# Patient Record
Sex: Male | Born: 1980
Health system: Southern US, Academic
[De-identification: ages and names within clinical notes are randomized; demographics above are authoritative.]

## PROBLEM LIST (undated history)

## (undated) ENCOUNTER — Encounter

## (undated) DIAGNOSIS — I10 Essential (primary) hypertension: Secondary | ICD-10-CM

## (undated) DIAGNOSIS — E669 Obesity, unspecified: Secondary | ICD-10-CM

## (undated) DIAGNOSIS — E785 Hyperlipidemia, unspecified: Secondary | ICD-10-CM

## (undated) DIAGNOSIS — C801 Malignant (primary) neoplasm, unspecified: Secondary | ICD-10-CM

## (undated) HISTORY — DX: Hyperlipidemia, unspecified: E78.5

## (undated) HISTORY — PX: EYE SURGERY: SHX253

## (undated) HISTORY — DX: Obesity, unspecified: E66.9

## (undated) HISTORY — PX: VASECTOMY: SHX75

---
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
## 2016-06-17 ENCOUNTER — Ambulatory Visit

## 2016-06-17 DIAGNOSIS — J020 Streptococcal pharyngitis: Secondary | ICD-10-CM

## 2016-06-17 DIAGNOSIS — J029 Acute pharyngitis, unspecified: Principal | ICD-10-CM

## 2016-06-21 MED ORDER — PENICILLIN G BENZATHINE 1200000 UNIT/2ML IM SUSP
1.2 10*6.[IU] | Freq: Once | INTRAMUSCULAR | Status: CP
Start: 2016-06-21 — End: ?

## 2016-06-21 MED ORDER — DEXAMETHASONE SODIUM PHOSPHATE 4 MG/ML IJ SOLN
8 mg | Freq: Once | INTRAMUSCULAR | Status: CP
Start: 2016-06-21 — End: ?

## 2016-06-21 NOTE — Progress Notes
-  ER at 06/17/16 [id number]         User Key  (r) = Recorded By, (t) = Taken By, (c) = Cosigned By    Initials Name Effective Dates    ER Fuller SongRosemond-Smith, Erica, [place] 10/08/14 -         Physical Exam   Constitutional: He is oriented to person, place, and time. He appears well-developed and well-nourished. No distress.   HENT:   Head: Normocephalic and atraumatic.   Right Ear: Tympanic membrane, external ear and ear canal normal.   Left Ear: Tympanic membrane, external ear and ear canal normal.   Mouth/Throat: Uvula is midline and mucous membranes are normal. No tonsillar exudate.   Edematous almost touching tonsils.  Pharyngeal erythema.   Eyes: Conjunctivae are normal. No scleral icterus.   Neck: Neck supple. No thyromegaly present.   Cardiovascular: Normal rate.    Pulmonary/Chest: Effort normal and breath sounds normal. No respiratory distress. He has no wheezes.   Lymphadenopathy:     He has no cervical adenopathy.     He has no axillary adenopathy.        Right: No supraclavicular adenopathy present.        Left: No supraclavicular adenopathy present.   Neurological: He is alert and oriented to person, place, and time.   Skin: Skin is intact. No pallor.   Psychiatric: He has a normal mood and affect. Thought content normal.   Nursing note and vitals reviewed.       Assessment:       ICD-10-CM ICD-9-CM    1. Sore throat J02.9 462 POCT rapid strep A-Inhouse test      dexamethasone (DECADRON) injection 8 mg      penicillin G benzathine (BICILLIN L-A) injection 1.2 Million Units   2. Strep throat J02.0 034.0 dexamethasone (DECADRON) injection 8 mg      penicillin G benzathine (BICILLIN L-A) injection 1.2 Million Units          Plan:     No orders of the following type(s) were placed in this encounter: Medications.     Orders Placed This Encounter   Procedures   ? POCT rapid strep A-Inhouse test       Health Maintenance was reviewed. The patient's HM Topic list was:

## 2016-06-21 NOTE — Progress Notes
Health Maintenance   Topic Date Due   ? Preventive Wellness Visit  11/10/2016 (Originally 05/08/1997)   ? USPSTF HIV Risk Assessment  12/07/2016 (Originally 05/08/1994)   ? Lipid Profile  12/08/2016 (Originally 05/08/2014)   ? Varicella Vaccine (1 of 2 - 2 Dose Adolescent Series) 01/05/2017 (Originally 05/08/1992)   ? DTaP,Tdap,and Td Vaccines (1 - Tdap) 01/12/2017 (Originally 05/08/1998)   ? Influenza Vaccine  Addressed       Results for orders placed or performed in visit on 06/17/16   POCT rapid strep A-Inhouse test   Result Value Ref Range    Strep A Ag Positive (A) None Detected, Presumptive Postive. Positive results are unconfirmed and should be used for medical evaluation only., Not Detected, Indeterminate    Lot Number      Expiration Date         Treatment plan as above.  Patient to return to clinic if symptoms worsen or fail to improve as anticipated.      I, Thana FarrJessica Castillo, have acted as a Neurosurgeonscribe for Evan DysJeff Jacqmein, MD. 8:47 AM 06/17/2016    ***

## 2016-06-21 NOTE — Progress Notes
Constitutional: Negative for activity change, appetite change, chills, diaphoresis, fatigue, fever and unexpected weight change.   HENT: Positive for sore throat, trouble swallowing and voice change. Negative for congestion, dental problem, drooling, ear discharge, ear pain, facial swelling, hearing loss, mouth sores, nosebleeds, postnasal drip, rhinorrhea, sinus pain and sinus pressure.    Eyes: Negative for visual disturbance.   Respiratory: Negative for cough, chest tightness, shortness of breath and wheezing.    Cardiovascular: Negative for chest pain and palpitations.   Gastrointestinal: Negative.    Genitourinary: Negative.    Musculoskeletal: Negative for arthralgias, myalgias and neck pain.   Skin: Negative for rash.   Neurological: Negative for dizziness, light-headedness and headaches.   Psychiatric/Behavioral: Negative for sleep disturbance.         Objective:        VITAL SIGNS (all recorded)      Clinic Vitals       06/17/16 0836             Amb Encounter Vitals    Weight 125.6 kg (277 lb)    -ER at 06/17/16 0837       Height 1.702 m (5\' 7" )    -ER at 06/17/16 0837       BMI (Calculated) 43.48    -ER at 06/17/16 0837       BSA (Calculated - sq m) 2.44    -ER at 06/17/16 0837       BP 138/90    -ER at 06/17/16 0837       BP Location Left upper arm    -ER at 06/17/16 [id number]       Position Sitting    -ER at 06/17/16 0837       Pulse 80    -ER at 06/17/16 0837       Pulse Source Radial    -ER at 06/17/16 0837       Resp 18    -ER at 06/17/16 0837       Temp 37.6 ?C (99.7 ?F)    -ER at 06/17/16 0837       Temperature Source Oral    -ER at 06/17/16 0837       O2 Saturation 97 %    -ER at 06/17/16 0837       Pain Score SEVEN    -ER at 06/17/16 [id number]       Education/Communication Barriers?    Learning/Communication Barriers? No    -ER at 06/17/16 [id number]       Fall Risk Assessment    Had recent fall / Last 6 months? No recent fall    -ER at 06/17/16 [id number]       Does patient have a fear of falling? No

## 2016-06-21 NOTE — Progress Notes
Health Maintenance   Topic Date Due   ? Preventive Wellness Visit  11/10/2016 (Originally 05/08/1997)   ? USPSTF HIV Risk Assessment  12/07/2016 (Originally 05/08/1994)   ? Lipid Profile  12/08/2016 (Originally 05/08/2014)   ? Varicella Vaccine (1 of 2 - 2 Dose Adolescent Series) 01/05/2017 (Originally 05/08/1992)   ? DTaP,Tdap,and Td Vaccines (1 - Tdap) 01/12/2017 (Originally 05/08/1998)   ? Influenza Vaccine  Addressed       Results for orders placed or performed in visit on 06/17/16   POCT rapid strep A-Inhouse test   Result Value Ref Range    Strep A Ag Positive (A) None Detected, Presumptive Postive. Positive results are unconfirmed and should be used for medical evaluation only., Not Detected, Indeterminate    Lot Number      Expiration Date         Treatment plan as above.  Patient to return to clinic if symptoms worsen or fail to improve as anticipated.      I, Thana FarrJessica Castillo, have acted as a Neurosurgeonscribe for Loyce DysJeff Jacqmein, MD. 8:47 AM 06/17/2016    I have reviewed and confirmed the information stated by the scribe and made corrections and edits as appropriate.  I have personally provided the services documented by the scribe.      Madalyn RobJeffry A Jacqmein, MD

## 2016-06-21 NOTE — Progress Notes
Subjective:   Evan MontgomeryBenjamin L Sanford is a 35 y.o. male being seen today for Sore Throat       HPI  Patient presenting to clinic for evaluation of sore throat.   Patient reports that symptoms started 2 days ago with itchy/scratchy sensation to throat. Last night sore throat worsened with increased pain that he describes as glass shards. Has trouble swallowing due to pain and swelling. He also has noticed L sided ear fullness yesterday. Otherwise no fever, chills, sweats, fatigue, congestion, rhinorrhea, sinus pressure, cough, neck pain, myalgias, rash, lightheadedness, dizziness, or headaches. Patient explains that he has been out of work for paternity leave for the last 10 weeks and recently returned to work about 2 days ago. He reports likely sick contact from work. Patient states that he usually does not get sick. Comments that as a kid he had a persistent issues with tonsillitis but without tonsillectomy.      No past medical history on file.  Past Surgical History:   Procedure Laterality Date   ? EYE SURGERY  2010     Family History   Problem Relation Age of Onset   ? High Blood Pressure Mother    ? High Cholesterol Mother    ? No Known Problems Father      Social History     Social History   ? Marital status: Married     Spouse name: N/A   ? Number of children: N/A   ? Years of education: N/A     Occupational History   ? Not on file.     Social History Main Topics   ? Smoking status: Former Smoker   ? Smokeless tobacco: Never Used   ? Alcohol use Yes   ? Drug use: No   ? Sexual activity: Yes     Partners: Female     Other Topics Concern   ? Not on file     Social History Narrative     Current Outpatient Prescriptions on File Prior to Visit   Medication Sig   ? [DISCONTINUED] penicillin v potassium (VEETID) 500 MG Tablet Take 1 Tablet by mouth 2 times daily.     No current facility-administered medications on file prior to visit.      No Known Allergies      Review of Systems  Review of Systems

## 2016-06-22 ENCOUNTER — Ambulatory Visit: Attending: Family

## 2016-06-22 DIAGNOSIS — J069 Acute upper respiratory infection, unspecified: Principal | ICD-10-CM

## 2016-06-22 MED ORDER — PROAIR HFA 108 (90 BASE) MCG/ACT IN AERS
2 | Freq: Four times a day (QID) | RESPIRATORY_TRACT | 1 refills | Status: CP | PRN
Start: 2016-06-22 — End: ?

## 2016-06-22 MED ORDER — AZITHROMYCIN 250 MG PO TABS
0 refills | Status: CP
Start: 2016-06-22 — End: 2016-06-28

## 2016-06-22 MED ORDER — PREDNISONE 10 MG PO TABS
0 refills | Status: CP
Start: 2016-06-22 — End: 2016-06-28

## 2016-06-22 NOTE — Progress Notes
Sig: Inhale 2 puffs every 6 hours as needed for wheezing.     Dispense:  8.5 g     Refill:  1

## 2016-06-22 NOTE — Progress Notes
Right Ear: External ear and ear canal normal. Tympanic membrane is injected, erythematous and bulging.   Left Ear: External ear and ear canal normal. Tympanic membrane is injected, erythematous and bulging.   Nose: Mucosal edema and rhinorrhea present. Right sinus exhibits maxillary sinus tenderness and frontal sinus tenderness. Left sinus exhibits maxillary sinus tenderness and frontal sinus tenderness.   Mouth/Throat: Mucous membranes are dry. Posterior oropharyngeal edema and posterior oropharyngeal erythema present. Tonsils are 1+ on the right. Tonsils are 1+ on the left.   Neck: Neck supple.   Cardiovascular: Normal rate and regular rhythm.    Pulmonary/Chest: Effort normal. He has wheezes in the right upper field, the right middle field, the right lower field, the left upper field, the left middle field and the left lower field. He has rhonchi in the right upper field and the left upper field.   Lymphadenopathy:     He has cervical adenopathy.   Neurological: He is alert and oriented to person, place, and time.   Skin: Skin is warm and dry. There is pallor.   Psychiatric: He has a normal mood and affect. His behavior is normal. Judgment and thought content normal.   Nursing note and vitals reviewed.       Assessment:       ICD-10-CM ICD-9-CM    1. Acute upper respiratory infection J06.9 465.9 azithromycin (ZITHROMAX) 250 MG PO Tablet      predniSONE (DELTASONE) 10 MG PO Tablet      PROAIR HFA 108 (90 BASE) MCG/ACT IN Aerosol Solution          Plan:     Orders Placed This Encounter   Medications   ? azithromycin (ZITHROMAX) 250 MG PO Tablet     Sig: Take 2 tablets (500 mg) on  Day 1,  followed by 1 tablet (250 mg) once daily on Days 2 through 5.     Dispense:  6 tablet     Refill:  0   ? predniSONE (DELTASONE) 10 MG PO Tablet     Sig: 3 PO QD x 5 days in AM with food.     Dispense:  15 tablet     Refill:  0   ? PROAIR HFA 108 (90 BASE) MCG/ACT IN Aerosol Solution

## 2016-06-22 NOTE — Progress Notes
Subjective:   Evan Sanford is a 35 y.o. male being seen today for Nasal Congestion (chest congestion); Ear Fullness; and Cough       URI    This is a new problem. The current episode started in the past 7 days. The problem has been gradually worsening. The maximum temperature recorded prior to his arrival was 100.4 - 100.9 F. Associated symptoms include congestion, coughing, rhinorrhea, sinus pain, sneezing, a sore throat, swollen glands and wheezing. He has tried increased fluids, sleep and steam for the symptoms. The treatment provided no relief.         Past Surgical History:   Procedure Laterality Date   ? EYE SURGERY  2010     Family History   Problem Relation Age of Onset   ? High Blood Pressure Mother    ? High Cholesterol Mother    ? No Known Problems Father      Social History     Social History   ? Marital status: Married     Spouse name: N/A   ? Number of children: N/A   ? Years of education: N/A     Occupational History   ? Not on file.     Social History Main Topics   ? Smoking status: Former Smoker   ? Smokeless tobacco: Never Used   ? Alcohol use Yes   ? Drug use: No   ? Sexual activity: Yes     Partners: Female     Other Topics Concern   ? Not on file     Social History Narrative     No current outpatient prescriptions on file prior to visit.     Current Facility-Administered Medications on File Prior to Visit   Medication   ? [EXPIRED] dexamethasone (DECADRON) injection 8 mg   ? [EXPIRED] penicillin G benzathine (BICILLIN L-A) injection 1.2 Million Units     No Known Allergies      Review of Systems  Review of Systems   Constitutional: Positive for fatigue.   HENT: Positive for congestion, postnasal drip, rhinorrhea, sinus pain, sinus pressure, sneezing, sore throat and voice change.    Respiratory: Positive for cough, chest tightness and wheezing.    Cardiovascular: Negative.    Endocrine: Negative.    Neurological: Negative.            Objective:        VITAL SIGNS (all recorded)

## 2016-06-22 NOTE — Patient Instructions
?   You have changes in your:  ? Vision.  ? Hearing.  ? Thinking.  ? Mood.  This information is not intended to replace advice given to you by your health care provider. Make sure you discuss any questions you have with your health care provider.  Document Released: 11/30/2007 Document Revised: 02/14/2016 Document Reviewed: 09/18/2013  Elsevier Interactive Patient Education ? 2017 [employer]

## 2016-06-22 NOTE — Patient Instructions
Upper Respiratory Infection, Adult  Most upper respiratory infections (URIs) are caused by a virus. A URI affects the nose, throat, and upper air passages. The most common type of URI is often called "the common cold."  Follow these instructions at home:  ? Take medicines only as told by your doctor.  ? Gargle warm saltwater or take cough drops to comfort your throat as told by your doctor.  ? Use a warm mist humidifier or inhale steam from a shower to increase air moisture. This may make it easier to breathe.  ? Drink enough fluid to keep your pee (urine) clear or pale yellow.  ? Eat soups and other clear broths.  ? Have a healthy diet.  ? Rest as needed.  ? Go back to work when your fever is gone or your doctor says it is okay.  ? You may need to stay home longer to avoid giving your URI to others.  ? You can also wear a face mask and wash your hands often to prevent spread of the virus.  ? Use your inhaler more if you have asthma.  ? Do not use any tobacco products, including cigarettes, chewing tobacco, or electronic cigarettes. If you need help quitting, ask your doctor.  Contact a doctor if:  ? You are getting worse, not better.  ? Your symptoms are not helped by medicine.  ? You have chills.  ? You are getting more short of breath.  ? You have brown or red mucus.  ? You have yellow or brown discharge from your nose.  ? You have pain in your face, especially when you bend forward.  ? You have a fever.  ? You have puffy (swollen) neck glands.  ? You have pain while swallowing.  ? You have white areas in the back of your throat.  Get help right away if:  ? You have very bad or constant:  ? Headache.  ? Ear pain.  ? Pain in your forehead, behind your eyes, and over your cheekbones (sinus pain).  ? Chest pain.  ? You have long-lasting (chronic) lung disease and any of the following:  ? Wheezing.  ? Long-lasting cough.  ? Coughing up blood.  ? A change in your usual mucus.  ? You have a stiff neck.

## 2016-06-22 NOTE — Progress Notes
Clinic Vitals       06/17/16 0836 06/22/16 1010          Amb Encounter Vitals    Weight 125.6 kg (277 lb)    -ER at 06/17/16 0837 125.6 kg (277 lb)    -ER at 06/22/16 1010      Height 1.702 m (5\' 7" )    -ER at 06/17/16 0837 1.702 m (5\' 7" )    -ER at 06/22/16 1010      BMI (Calculated) 43.48    -ER at 06/17/16 0837 43.48    -ER at 06/22/16 1010      BSA (Calculated - sq m) 2.44    -ER at 06/17/16 0837 2.44    -ER at 06/22/16 1010      BP 138/90    -ER at 06/17/16 0837 140/90    -ER at 06/22/16 1010      BP Location Left upper arm    -ER at 06/17/16 [id number] Left upper arm    -ER at 06/22/16 1010      Position Sitting    -ER at 06/17/16 0837 Sitting    -ER at 06/22/16 1010      Pulse 80    -ER at 06/17/16 0837 88    -ER at 06/22/16 1010      Pulse Source Radial    -ER at 06/17/16 0837 Radial    -ER at 06/22/16 1010      Resp 18    -ER at 06/17/16 0837 18    -ER at 06/22/16 1010      Temp 37.6 ?C (99.7 ?F)    -ER at 06/17/16 0837 36.9 ?C (98.4 ?F)    -ER at 06/22/16 1010      Temperature Source Oral    -ER at 06/17/16 0837 Oral    -ER at 06/22/16 1010      O2 Saturation 97 %    -ER at 06/17/16 0837 94 %    -ER at 06/22/16 1010      Pain Score SEVEN    -ER at 06/17/16 [id number] SIX    -ER at 06/22/16 1010      Education/Communication Barriers?    Learning/Communication Barriers? No    -ER at 06/17/16 [id number] No    -ER at 06/22/16 1010      Fall Risk Assessment    Had recent fall / Last 6 months? No recent fall    -ER at 06/17/16 [id number] No recent fall    -ER at 06/22/16 1010      Does patient have a fear of falling? No    -ER at 06/17/16 [id number] No    -ER at 06/22/16 1010        User Key  (r) = Recorded By, (t) = Taken By, (c) = Cosigned By    Initials Name Effective Dates    ER Fuller SongRosemond-Smith, Erica, [place] 10/08/14 -         Physical Exam   Constitutional: He is oriented to person, place, and time. He appears well-developed and well-nourished. He is active. He appears ill. No distress.   HENT:

## 2016-06-28 ENCOUNTER — Ambulatory Visit: Attending: Family

## 2016-06-28 DIAGNOSIS — H7292 Unspecified perforation of tympanic membrane, left ear: Secondary | ICD-10-CM

## 2016-06-28 DIAGNOSIS — H6983 Other specified disorders of Eustachian tube, bilateral: Principal | ICD-10-CM

## 2016-06-28 MED ORDER — PREDNISONE 20 MG PO TABS
0 refills | Status: CP
Start: 2016-06-28 — End: ?

## 2016-06-28 MED ORDER — DEXAMETHASONE SODIUM PHOSPHATE 120 MG/30ML IJ SOLN
8 mg | Freq: Once | INTRAMUSCULAR | Status: CP
Start: 2016-06-28 — End: ?

## 2016-06-28 NOTE — Progress Notes
Weight 125.6 kg (277 lb)    -ER at 06/22/16 1010 126.1 kg (278 lb)    -JL at 06/28/16 1358      Height 1.702 m (5\' 7" )    -ER at 06/22/16 1010 1.702 m (5\' 7" )    -JL at 06/28/16 1358      BMI (Calculated) 43.48    -ER at 06/22/16 1010 43.63    -JL at 06/28/16 1358      BSA (Calculated - sq m) 2.44    -ER at 06/22/16 1010 2.44    -JL at 06/28/16 1358      BP 140/90    -ER at 06/22/16 1010 142/85    -JL at 06/28/16 1358      BP Location Left upper arm    -ER at 06/22/16 1010 Left upper arm    -JL at 06/28/16 1358      Position Sitting    -ER at 06/22/16 1010 Sitting    -JL at 06/28/16 1358      Pulse 88    -ER at 06/22/16 1010 69    -JL at 06/28/16 1358      Pulse Source Radial    -ER at 06/22/16 1010 Monitor    -JL at 06/28/16 1358      Pulse Quality  Normal    -JL at 06/28/16 1358      Resp 18    -ER at 06/22/16 1010 16    -JL at 06/28/16 1358      Respiration Quality  Normal    -JL at 06/28/16 1358      Temp 36.9 ?C (98.4 ?F)    -ER at 06/22/16 1010 37.1 ?C (98.8 ?F)    -JL at 06/28/16 1358      Temperature Source Oral    -ER at 06/22/16 1010 Oral    -JL at 06/28/16 1358      O2 Saturation 94 %    -ER at 06/22/16 1010 96 %    -JL at 06/28/16 1358      FiO2 Source  RA    -JL at 06/28/16 1358      Pain Score SIX    -ER at 06/22/16 1010 Zero    -JL at 06/28/16 1358      Education/Communication Barriers?    Learning/Communication Barriers? No    -ER at 06/22/16 1010 No    -JL at 06/28/16 1358      Fall Risk Assessment    Had recent fall / Last 6 months? No recent fall    -ER at 06/22/16 1010 No recent fall    -JL at 06/28/16 1358      Does patient have a fear of falling? No    -ER at 06/22/16 1010 No    -JL at 06/28/16 1358        User Key  (r) = Recorded By, (t) = Taken By, (c) = Cosigned By    Initials Name Effective Dates    ER Fuller SongRosemond-Smith, Erica, [place] 10/08/14 Daisey Must-     JL Lowe, Joshua, MA 12/11/15 -         Physical Exam   Constitutional: He is oriented to person, place, and time. He appears

## 2016-06-28 NOTE — Progress Notes
well-developed and well-nourished. No distress.   HENT:   Right Ear: Tympanic membrane is bulging.   Left Ear: Tympanic membrane is perforated and bulging.   Ears:    Neck: Neck supple.   Cardiovascular: Normal rate and regular rhythm.    Pulmonary/Chest: Effort normal and breath sounds normal.   Neurological: He is alert and oriented to person, place, and time.   Skin: Skin is warm and dry.   Psychiatric: He has a normal mood and affect. His behavior is normal. Judgment and thought content normal.   Nursing note and vitals reviewed.       Assessment:       ICD-10-CM ICD-9-CM    1. Eustachian tube dysfunction, bilateral H69.83 381.81 Dexamethasone Sodium Phosphate SOLN 8 mg      predniSONE (DELTASONE) 20 MG PO Tablet          Plan:     Orders Placed This Encounter   Medications   ? Dexamethasone Sodium Phosphate SOLN 8 mg   ? predniSONE (DELTASONE) 20 MG PO Tablet     Sig: 3 PO QD x 5 days in AM with food.     Dispense:  15 tablet     Refill:  0

## 2016-06-28 NOTE — Progress Notes
Subjective:   Evan Sanford is a 36 y.o. male being seen today for Follow-up (ears)       Otalgia    There is pain in both (slightly worse on left side) ears. This is a recurrent problem. The problem occurs constantly. There has been no fever. The pain is at a severity of 6/10. The pain is moderate. Associated symptoms include hearing loss. He has tried nothing for the symptoms.       Past Surgical History:   Procedure Laterality Date   ? EYE SURGERY  2010     Family History   Problem Relation Age of Onset   ? High Blood Pressure Mother    ? High Cholesterol Mother    ? No Known Problems Father      Social History     Social History   ? Marital status: Married     Spouse name: N/A   ? Number of children: N/A   ? Years of education: N/A     Occupational History   ? Not on file.     Social History Main Topics   ? Smoking status: Former Smoker   ? Smokeless tobacco: Never Used   ? Alcohol use Yes   ? Drug use: No   ? Sexual activity: Yes     Partners: Female     Other Topics Concern   ? Not on file     Social History Narrative     Current Outpatient Prescriptions on File Prior to Visit   Medication Sig   ? [DISCONTINUED] azithromycin (ZITHROMAX) 250 MG PO Tablet Take 2 tablets (500 mg) on  Day 1,  followed by 1 tablet (250 mg) once daily on Days 2 through 5.   ? [DISCONTINUED] predniSONE (DELTASONE) 10 MG PO Tablet 3 PO QD x 5 days in AM with food.   ? PROAIR HFA 108 (90 BASE) MCG/ACT IN Aerosol Solution Inhale 2 puffs every 6 hours as needed for wheezing.     No current facility-administered medications on file prior to visit.      No Known Allergies      Review of Systems  Review of Systems   Constitutional: Negative.    HENT: Positive for ear pain and hearing loss.    Respiratory: Negative.    Cardiovascular: Negative.    Endocrine: Negative.    Neurological: Negative.            Objective:        VITAL SIGNS (all recorded)      Clinic Vitals       06/22/16 1010 06/28/16 1354          Amb Encounter Vitals

## 2016-06-28 NOTE — Patient Instructions
Treatment depends on the cause and severity of your condition. If your symptoms are mild, you may be able to relieve your symptoms by moving air into ("popping") your ears. If you have symptoms of fluid in your ears, treatment may include:  ? Decongestants.  ? Antihistamines.  ? Nasal sprays or ear drops that contain medicines that reduce swelling (steroids).  In some cases, you may need to have a procedure to drain the fluid in your eardrum (myringotomy). In this procedure, a small tube is placed in the eardrum to:  ? Drain the fluid.  ? Restore the air in the middle ear space.  Follow these instructions at home:  ? Take over-the-counter and prescription medicines only as told by your health care provider.  ? Use techniques to help pop your ears as recommended by your health care provider. These may include:  ? Chewing gum.  ? Yawning.  ? Frequent, forceful swallowing.  ? Closing your mouth, holding your nose closed, and gently blowing as if you are trying to blow air out of your nose.  ? Do not do any of the following until your health care provider approves:  ? Travel to high altitudes.  ? Fly in airplanes.  ? Work in a pressurized cabin or room.  ? Scuba dive.  ? Keep your ears dry. Dry your ears completely after showering or bathing.  ? Do not smoke.  ? Keep all follow-up visits as told by your health care provider. This is important.  Contact a health care provider if:  ? Your symptoms do not go away after treatment.  ? Your symptoms come back after treatment.  ? You are unable to pop your ears.  ? You have:  ? A fever.  ? Pain in your ear.  ? Pain in your head or neck.  ? Fluid draining from your ear.  ? Your hearing suddenly changes.  ? You become very dizzy.  ? You lose your balance.  This information is not intended to replace advice given to you by your health care provider. Make sure you discuss any questions you have with your health care provider.

## 2016-06-28 NOTE — Patient Instructions
Eustachian Tube Dysfunction  Introduction  The eustachian tube connects the middle ear to the back of the nose. It regulates air pressure in the middle ear by allowing air to move between the ear and nose. It also helps to drain fluid from the middle ear space. When the eustachian tube does not function properly, air pressure, fluid, or both can build up in the middle ear.  Eustachian tube dysfunction can affect one or both ears.  What are the causes?  This condition happens when the eustachian tube becomes blocked or cannot open normally. This may result from:  ? Ear infections.  ? Colds and other upper respiratory infections.  ? Allergies.  ? Irritation, such as from cigarette smoke or acid from the stomach coming up into the esophagus (gastroesophageal reflux).  ? Sudden changes in air pressure, such as from descending in an airplane.  ? Abnormal growths in the nose or throat, such as nasal polyps, tumors, or enlarged tissue at the back of the throat (adenoids).  What increases the risk?  This condition may be more likely to develop in people who smoke and people who are overweight. Eustachian tube dysfunction may also be more likely to develop in children, especially children who have:  ? Certain birth defects of the mouth, such as cleft palate.  ? Large tonsils and adenoids.  What are the signs or symptoms?  Symptoms of this condition may include:  ? A feeling of fullness in the ear.  ? Ear pain.  ? Clicking or popping noises in the ear.  ? Ringing in the ear.  ? Hearing loss.  ? Loss of balance.  Symptoms may get worse when the air pressure around you changes, such as when you travel to an area of high elevation or fly on an airplane.  How is this diagnosed?  This condition may be diagnosed based on:  ? Your symptoms.  ? A physical exam of your ear, nose, and throat.  ? Tests, such as those that measure:  ? The movement of your eardrum (tympanogram).  ? Your hearing (audiometry).  How is this treated?

## 2016-06-28 NOTE — Patient Instructions
Document Released: 07/10/2015 Document Revised: 11/19/2015 Document Reviewed: 07/02/2014  ? 2017 Elsevier

## 2016-08-01 ENCOUNTER — Encounter: Attending: Family

## 2019-05-22 ENCOUNTER — Other Ambulatory Visit: Payer: Self-pay

## 2019-05-22 ENCOUNTER — Encounter: Payer: Self-pay | Admitting: Dietician

## 2019-05-22 ENCOUNTER — Encounter: Payer: BC Managed Care – PPO | Attending: Surgery | Admitting: Dietician

## 2019-05-22 DIAGNOSIS — E669 Obesity, unspecified: Secondary | ICD-10-CM | POA: Diagnosis present

## 2019-05-22 NOTE — Progress Notes (Signed)
Nutrition Assessment for Bariatric Surgery Medical Nutrition Therapy  Appt Start Time: 3:00pm    End Time: 3:45pm  Patient was seen on 05/22/2019 for Pre-Operative Nutrition Assessment. Letter of approval faxed to [facility] bariatric surgery program coordinator on 05/22/2019.   Referral stated Supervised Weight Loss (SWL) visits needed: 0  Planned surgery: Lap Band Pt expectation of surgery: to not feel out of breath, have more energy to play with kids, maintain weight loss Pt expectation of dietitian: provide guidance with diet after surgery    NUTRITION ASSESSMENT   Anthropometrics  Start weight at [facility]: 297 lbs (date: 05/22/2019) Height: 69.5 in BMI: 43.2 kg/m2    Clinical  Medical hx: obesity, HTN, hypercholesterolemia   Lifestyle & Dietary Hx Works in Engineer, technical sales for [employer]. Married and has 3 young children. Very personable, and is knowledgeable about nutrition. Wife had duodenal switch surgery and mother in law had lap band surgery. Motivated for surgery and has done research to prepare.   Typical meal pattern is 3 meals per day. May have pizza for dinner, will get extra to have leftovers for lunch. Wife does most cooking, sometimes pt states he will smoke meats for supper. States his wife often gives him her leftovers after she eats her small portion of her plate. Likes drinking coffee and will have 1 diet Dr. Malachi Bonds per day.   24-Hr Dietary Recall First Meal: pita pocket w/ [place] & egg Snack: - Second Meal: ham & cheese sandwich  Snack: - Third Meal: smoked pulled pork  Snack: - Beverages: water, coffee, diet Dr. Malachi Bonds   Estimated Energy Needs Calories: 1800 Carbohydrate: 200g Protein: 113g Fat: 60g   NUTRITION DIAGNOSIS  Overweight/obesity ([place]-3.3) related to past poor dietary habits and physical inactivity as evidenced by patient w/ planned Lap Band surgery following dietary guidelines for continued weight loss.    NUTRITION INTERVENTION   Nutrition counseling (C-1) and education (E-2) to facilitate bariatric surgery goals.  Pre-Op Goals Reviewed with the Patient . Track food and beverage intake (pen and paper, MyFitness Pal, Baritastic app, etc.) . Make healthy food choices while monitoring portion sizes . Consume 3 meals per day or try to eat every 3-5 hours . Avoid concentrated sugars and fried foods . Keep sugar & fat in the single digits per serving on food labels . Practice CHEWING your food (aim for applesauce consistency) . Practice not drinking 15 minutes before, during, and 30 minutes after each meal and snack . Avoid all carbonated beverages (ex: soda, sparkling beverages)  . Limit caffeinated beverages (ex: coffee, tea, energy drinks) . Avoid all sugar-sweetened beverages (ex: regular soda, sports drinks)  . Avoid alcohol  . Aim for 64-100 ounces of FLUID daily (with at least half of fluid intake being plain water)  . Aim for at least 60-80 grams of PROTEIN daily . Look for a liquid protein source that contains ?15 g protein and ?5 g carbohydrate (ex: shakes, drinks, shots) . Make a list of non-food related activities . Physical activity is an important part of a healthy lifestyle so keep it moving! The goal is to reach 150 minutes of exercise per week, including cardiovascular and weight baring activity.  Handouts Provided Include  . Bariatric Surgery handouts (Nutrition Visits, Pre-Op Goals, Protein Shakes, Vitamins & Minerals)  Learning Style & Readiness for Change Teaching method utilized: Visual & Auditory  Demonstrated degree of understanding via: Teach Back  Barriers to learning/adherence to lifestyle change: None Identified    MONITORING &  EVALUATION Dietary intake, weekly physical activity, body weight, and pre-op goals reached at next nutrition visit.   Next Steps Patient is to call [facility] to enroll in Pre-Op Class (>2 weeks before surgery) for further nutrition education when surgery date is  scheduled.

## 2019-09-20 ENCOUNTER — Other Ambulatory Visit (HOSPITAL_COMMUNITY): Payer: Self-pay | Admitting: Surgery

## 2019-09-20 ENCOUNTER — Other Ambulatory Visit: Payer: Self-pay | Admitting: Surgery

## 2019-10-14 ENCOUNTER — Ambulatory Visit (HOSPITAL_COMMUNITY)
Admission: RE | Admit: 2019-10-14 | Discharge: 2019-10-14 | Disposition: A | Discharge status: Home / Self Care | Payer: BC Managed Care – PPO | Source: Ambulatory Visit | Attending: Surgery | Admitting: Surgery

## 2019-10-14 ENCOUNTER — Other Ambulatory Visit: Payer: Self-pay

## 2019-11-06 ENCOUNTER — Other Ambulatory Visit (HOSPITAL_COMMUNITY): Payer: Self-pay | Admitting: Surgery

## 2019-12-03 ENCOUNTER — Other Ambulatory Visit (HOSPITAL_COMMUNITY)
Admission: RE | Admit: 2019-12-03 | Discharge: 2019-12-03 | Disposition: A | Discharge status: Home / Self Care | Payer: BC Managed Care – PPO | Source: Ambulatory Visit | Attending: Surgery | Admitting: Surgery

## 2019-12-03 DIAGNOSIS — Z20822 Contact with and (suspected) exposure to covid-19: Secondary | ICD-10-CM | POA: Insufficient documentation

## 2019-12-03 DIAGNOSIS — Z01812 Encounter for preprocedural laboratory examination: Secondary | ICD-10-CM | POA: Insufficient documentation

## 2019-12-03 LAB — SARS CORONAVIRUS 2 (TAT 6-24 HRS): SARS Coronavirus 2: NEGATIVE

## 2019-12-06 ENCOUNTER — Encounter (HOSPITAL_COMMUNITY)
Admission: RE | Disposition: A | Discharge status: Home / Self Care | Payer: Self-pay | Source: Home / Self Care | Attending: Surgery

## 2019-12-06 ENCOUNTER — Other Ambulatory Visit: Payer: Self-pay

## 2019-12-06 ENCOUNTER — Ambulatory Visit (HOSPITAL_COMMUNITY): Payer: BC Managed Care – PPO | Admitting: Anesthesiology

## 2019-12-06 ENCOUNTER — Encounter (HOSPITAL_COMMUNITY): Payer: Self-pay | Admitting: Surgery

## 2019-12-06 ENCOUNTER — Ambulatory Visit (HOSPITAL_COMMUNITY)
Admission: RE | Admit: 2019-12-06 | Discharge: 2019-12-06 | Disposition: A | Discharge status: Home / Self Care | Payer: BC Managed Care – PPO | Attending: Surgery | Admitting: Surgery

## 2019-12-06 DIAGNOSIS — I10 Essential (primary) hypertension: Secondary | ICD-10-CM | POA: Diagnosis not present

## 2019-12-06 DIAGNOSIS — Z8249 Family history of ischemic heart disease and other diseases of the circulatory system: Secondary | ICD-10-CM | POA: Diagnosis not present

## 2019-12-06 DIAGNOSIS — Z87891 Personal history of nicotine dependence: Secondary | ICD-10-CM | POA: Diagnosis not present

## 2019-12-06 DIAGNOSIS — Z6841 Body Mass Index (BMI) 40.0 and over, adult: Secondary | ICD-10-CM | POA: Diagnosis not present

## 2019-12-06 DIAGNOSIS — Z01818 Encounter for other preprocedural examination: Secondary | ICD-10-CM | POA: Insufficient documentation

## 2019-12-06 DIAGNOSIS — R933 Abnormal findings on diagnostic imaging of other parts of digestive tract: Secondary | ICD-10-CM | POA: Diagnosis present

## 2019-12-06 HISTORY — PX: ESOPHAGOGASTRODUODENOSCOPY: SHX5428

## 2019-12-06 SURGERY — EGD (ESOPHAGOGASTRODUODENOSCOPY)
Anesthesia: Monitor Anesthesia Care

## 2019-12-06 MED ORDER — SODIUM CHLORIDE 0.9 % IV SOLN: INTRAVENOUS | Status: DC

## 2019-12-06 MED ORDER — PROPOFOL 500 MG/50ML IV EMUL
INTRAVENOUS | Status: DC | PRN
  Administered 2019-12-06: 175 ug/kg/min via INTRAVENOUS

## 2019-12-06 MED ORDER — MIDAZOLAM HCL 2 MG/2ML IJ SOLN
INTRAMUSCULAR | Status: AC
  Filled 2019-12-06: qty 2

## 2019-12-06 MED ORDER — MIDAZOLAM HCL 5 MG/5ML IJ SOLN
INTRAMUSCULAR | Status: DC | PRN
  Administered 2019-12-06: 2 mg via INTRAVENOUS

## 2019-12-06 MED ORDER — PROPOFOL 10 MG/ML IV BOLUS
INTRAVENOUS | Status: AC
  Filled 2019-12-06: qty 20

## 2019-12-06 MED ORDER — LACTATED RINGERS IV SOLN
INTRAVENOUS | Status: DC
  Administered 2019-12-06: 1000 mL via INTRAVENOUS

## 2019-12-06 MED ORDER — PROPOFOL 500 MG/50ML IV EMUL
INTRAVENOUS | Status: AC
  Filled 2019-12-06: qty 200

## 2019-12-06 NOTE — Anesthesia Preprocedure Evaluation (Addendum)
Anesthesia Evaluation  Patient identified by MRN, date of birth, ID band Patient awake    Reviewed: Allergy & Precautions, NPO status , Patient's Chart, lab work & pertinent test results  Airway Mallampati: II  TM Distance: >3 FB Neck ROM: Full    Dental no notable dental hx. (+) Teeth Intact, Dental Advisory Given   Pulmonary neg pulmonary ROS, former smoker,    Pulmonary exam normal breath sounds clear to auscultation       Cardiovascular hypertension, Normal cardiovascular exam Rhythm:Regular Rate:Normal  HLD   Neuro/Psych negative neurological ROS  negative psych ROS   GI/Hepatic negative GI ROS, Neg liver ROS,   Endo/Other  Morbid obesity (BMI 43)  Renal/GU negative Renal ROS  negative genitourinary   Musculoskeletal negative musculoskeletal ROS (+)   Abdominal   Peds  Hematology negative hematology ROS (+)   Anesthesia Other Findings EGD for esophageal stenosis  Reproductive/Obstetrics                            Anesthesia Physical Anesthesia Plan  ASA: II  Anesthesia Plan: MAC   Post-op Pain Management:    Induction: Intravenous  PONV Risk Score and Plan: 1 and Propofol infusion and Treatment may vary due to age or medical condition  Airway Management Planned: Natural Airway  Additional Equipment:   Intra-op Plan:   Post-operative Plan:   Informed Consent: I have reviewed the patients History and Physical, chart, labs and discussed the procedure including the risks, benefits and alternatives for the proposed anesthesia with the patient or authorized representative who has indicated his/her understanding and acceptance.     Dental advisory given  Plan Discussed with: CRNA  Anesthesia Plan Comments:        Anesthesia Quick Evaluation

## 2019-12-06 NOTE — Discharge Instructions (Signed)
[facility] - DISCHARGE INSTRUCTIONS TO PATIENT  Return to work on:  May return to work tomorrow  Activity:   Driving - May resume driving tomorrow  Diet:  As tolerated  Follow up appointment:  I will check with office regarding weight loss surgery.  Call Dr. Lucia Gaskins or his office  [phone]) if you have:  Severe uncontrolled pain,  Any other questions or concerns you may have after discharge.  In an emergency, call 911 or go to an Emergency Department at a nearby hospital.

## 2019-12-06 NOTE — Interval H&P Note (Signed)
History and Physical Interval Note:  12/06/2019 1:31 PM  Benjamin Pearson  has presented today for surgery, with the diagnosis of NARROWING OF DISTAL ESOPHAGUS.  The various methods of treatment have been discussed with the patient and family.   Wife is with him.  After consideration of risks, benefits and other options for treatment, the patient has consented to  Procedure(s): ESOPHAGOGASTRODUODENOSCOPY (EGD) (N/A) as a surgical intervention.  The patient's history has been reviewed, patient examined, no change in status, stable for surgery.  I have reviewed the patient's chart and labs.  Questions were answered to the patient's satisfaction.     Shann Medal

## 2019-12-06 NOTE — Transfer of Care (Signed)
Immediate Anesthesia Transfer of Care Note  Patient: Benjamin Pearson  Procedure(s) Performed: ESOPHAGOGASTRODUODENOSCOPY (EGD) (N/A )  Patient Location: PACU  Anesthesia Type:MAC  Level of Consciousness: sedated, patient cooperative and responds to stimulation  Airway & Oxygen Therapy: Patient Spontanous Breathing and Patient connected to face mask oxygen  Post-op Assessment: Report given to RN and Post -op Vital signs reviewed and stable  Post vital signs: Reviewed and stable  Last Vitals:  Vitals Value Taken Time  BP    Temp    Pulse 79 12/06/19 1354  Resp 26 12/06/19 1354  SpO2 100 % 12/06/19 1354  Vitals shown include unvalidated device data.  Last Pain:  Vitals:   12/06/19 1257  TempSrc: Oral  PainSc: 0-No pain         Complications: No complications documented.

## 2019-12-06 NOTE — H&P (Signed)
Benjamin Pearson  Location: [facility] Patient #: [id number] DOB: [birth year] Married / Language: English / Race: White Male  History of Present Illness   The patient is a 39 year old male who presents with a complaint of weight loss surgery.  The PCP is Charlyne Mom, [place] [phone])  He comes by himself.  [The Covid-19 virus has disrupted normal medical care in [place] and across the nation. We have sometimes had to alter normal surgical/medical care to limit this epidemic and we have explained these changes to the patient.]  The patient is interested in weight loss surgery. He moved from [place] to [place] about 2 years ago. He said that he knew of several people in [place] who had had a lap band and this was still commonly performed down there. His mother-in-law had a lap band over duodenal switch. She has done well with this. His wife has had a duodenal switch. So he is interested in having lap band surgery. He has tried multiple diets to lose weight. He tried Weight Watchers, cell phone apps to follow, and a low-calorie diets. He was most successful lose about 80 pounds when he could jog every day and had a calorie intake of about 1000 cal. He now has 3 children which makes that much of her. He's also been on and off phentermine several times.  Per the [facility], the patient is a candidate for bariatric surgery. The patient listened to our online information session and reviewed the different types of bariatric surgery.  The patient is interested in the laparoscopic adjustable gastric band. I discussed with the patient the indications and risks of lap band surgery. The potential risks of surgery include, but are not limited to, bleeding, infection, DVT and PE, slippage and erosion of the band, open surgery, and death. The patient understands the importance of compliance and long term follow-up with our  group after surgery. He was given literature regarding lap band surgery. I showed him the model of the lap band. I gave him a book on weight loss surgery. I explained the most common operations for weight loss in the [place] are the laparoscopic sleeve gastrectomy and gastric bypass. I explained to him that the lap band represents well below 5% a weight loss operations in the [place], in part because of failure to obtain significant weight loss. I do have patients who do well with a lap band and patients with struggle with a lap band. He seems to have much better insight than the average patient about weight loss surgery, particularly the lap band. I discussed lap band fills and having to drive to [facility] for lap band management. He works in [place] - so he is used to driving 90 minutes to work, so follow up here for lap band fills would not bother his. From here we'll obtain labs, x-rays, nutrition consult, and psych consult.  Review of Systems as stated in this history (HPI) or in the review of systems. Otherwise all other 12 point ROS are negative  Past Medical History: 1. Interested in weight loss surgery weight - 299, BMI - 43.8 2. Elevated cholesterol  Social History: Married. He wife had a duodenal switch - she has some vitiamin issues.   He has 3 children: 84 yo, 47 yo, and 79 yo. Her works for Engineer, technical sales for [employer]. He works in [place] - so he is used to driving 90 minutes to work, so follow up here for lap band fills  would not bother his.  Past Surgical History (April Staton, [place]; 05/10/2019 3:24 PM) Vasectomy   Diagnostic Studies History (April Staton, [place]; 05/10/2019 3:24 PM) Colonoscopy  never  Allergies (April Staton, [place]; 05/10/2019 3:24 PM) No Known Drug Allergies  [05/10/2019]:  Medication History (April Staton, CMA; 05/10/2019 3:24 PM) No Current Medications Medications Reconciled  Social History (April Staton, CMA; 05/10/2019 3:24  PM) Alcohol use  Occasional alcohol use. Caffeine use  Coffee. No drug use  Tobacco use  Former smoker.  Family History (April Staton, [place]; 05/10/2019 3:24 PM) Cancer  Mother. Heart Disease  Mother. Hypertension  Mother. Melanoma  Mother.  Other Problems (April Staton, CMA; 05/10/2019 3:24 PM) High blood pressure  Hypercholesterolemia     Review of Systems (April Staton CMA; 05/10/2019 3:24 PM) General Present- Weight Gain. Not Present- Appetite Loss, Chills, Fatigue, Fever, Night Sweats and Weight Loss. Skin Not Present- Change in Wart/Mole, Dryness, Hives, Jaundice, New Lesions, Non-Healing Wounds, Rash and Ulcer. HEENT Not Present- Earache, Hearing Loss, Hoarseness, Nose Bleed, Oral Ulcers, Ringing in the Ears, Seasonal Allergies, Sinus Pain, Sore Throat, Visual Disturbances, Wears glasses/contact lenses and Yellow Eyes. Respiratory Not Present- Bloody sputum, Chronic Cough, Difficulty Breathing, Snoring and Wheezing. Breast Not Present- Breast Mass, Breast Pain, Nipple Discharge and Skin Changes. Cardiovascular Not Present- Chest Pain, Difficulty Breathing Lying Down, Leg Cramps, Palpitations, Rapid Heart Rate, Shortness of Breath and Swelling of Extremities. Gastrointestinal Not Present- Abdominal Pain, Bloating, Bloody Stool, Change in Bowel Habits, Chronic diarrhea, Constipation, Difficulty Swallowing, Excessive gas, Gets full quickly at meals, Hemorrhoids, Indigestion, Nausea, Rectal Pain and Vomiting. Male Genitourinary Not Present- Blood in Urine, Change in Urinary Stream, Frequency, Impotence, Nocturia, Painful Urination, Urgency and Urine Leakage. Musculoskeletal Not Present- Back Pain, Joint Pain, Joint Stiffness, Muscle Pain, Muscle Weakness and Swelling of Extremities. Neurological Not Present- Decreased Memory, Fainting, Headaches, Numbness, Seizures, Tingling, Tremor, Trouble walking and Weakness. Psychiatric Not Present- Anxiety, Bipolar, Change in Sleep  Pattern, Depression, Fearful and Frequent crying. Endocrine Not Present- Cold Intolerance, Excessive Hunger, Hair Changes, Heat Intolerance, Hot flashes and New Diabetes. Hematology Not Present- Blood Thinners, Easy Bruising, Excessive bleeding, Gland problems, HIV and Persistent Infections.  Vitals (April Staton CMA; 05/10/2019 3:27 PM) 05/10/2019 3:24 PM Weight: 299.25 lb Height: 69.25in Body Surface Area: 2.46 m Body Mass Index: 43.87 kg/m  Temp.: 97.39F (Oral)  Pulse: 85 (Regular)    Physical Exam  General: Obese WN WM who is alert and generally healthy appearing. He is wearing a mask. Skin: Inspection and palpation of the skin unremarkable.  Eyes: Conjunctivae white, pupils equal. Face, ears, nose, mouth, and throat: Face - mask  Neck: Supple. No mass. Trachea midline. No thyroid mass. Lymph Nodes: No supraclavicular or cervical adenopathy. No axillary adenopathy.  Lungs: Normal respiratory effort. Clear to auscultation and symmetric breath sounds. Cardiovascular: Regular rate and rhythm. Normal auscultation of the heart. No murmur or rub.  Abdomen: Soft. No mass. Liver and spleen not palpable. No tenderness. No hernia. Normal bowel sounds. No abdominal scars.  He is about 1/2 apple and 1/2 pear. He does have a mild pannus. Rectal: Not done.  Musculoskeletal/extremities: Normal gait. Good strength and ROM in upper and lower extremities.  Neurologic: Grossly intact to motor and sensory function. Psychiatric: Has normal mood and affect.  Judgement and insight appear normal.  Assessment & Plan  1.  Abnormal UGI  Plan upper endo to evaluate esophagus  2.  MORBID OBESITY (E66.01)  Plan - interested in a lap band  Alphonsa Overall, MD, [facility] Office phone:  [phone]

## 2019-12-06 NOTE — Anesthesia Postprocedure Evaluation (Signed)
Anesthesia Post Note  Patient: Benjamin Pearson  Procedure(s) Performed: ESOPHAGOGASTRODUODENOSCOPY (EGD) (N/A )     Patient location during evaluation: Endoscopy Anesthesia Type: MAC Level of consciousness: awake and alert Pain management: pain level controlled Vital Signs Assessment: post-procedure vital signs reviewed and stable Respiratory status: spontaneous breathing, nonlabored ventilation, respiratory function stable and patient connected to nasal cannula oxygen Cardiovascular status: blood pressure returned to baseline and stable Postop Assessment: no apparent nausea or vomiting Anesthetic complications: no   No complications documented.  Last Vitals:  Vitals:   12/06/19 1400 12/06/19 1410  BP: (!) 154/84 (!) 155/91  Pulse: 75 69  Resp: 16 (!) 27  Temp:    SpO2: 97% 98%    Last Pain:  Vitals:   12/06/19 1410  TempSrc:   PainSc: 0-No pain                 Litha Lamartina L Unknown Flannigan

## 2019-12-06 NOTE — Op Note (Addendum)
12/06/2019  1:51 PM  PATIENT:  Benjamin Pearson, 39 y.o., male, MRN: [id number]  PREOP DIAGNOSIS:  Possible EG stricture  POSTOP DIAGNOSIS:   Normal EG junction and normal upper endo  PROCEDURE:  Esophagogastroduedonoscopy  SURGEON:   Alphonsa Overall, M.D.  ANESTHESIA:   Anesthesiologist: Freddrick March, MD CRNA: Gean Maidens, CRNA  INDICATIONS FOR PROCEDURE:  [name] is a 39 y.o. (DOB: [birth year])  white male whose primary care physician is [facility], Pcp Not In and comes for upper endoscopy to evaluate possible esophageal stricture.  He had an UGI on 10/14/2019 in preparation for weight loss surgery which showed mild narrowing in the distal esophagus.   The indications and risks of the endoscopy were explained to the patient.  The risks include, but are not limited to, perforation, bleeding, or injury to the bowel.  PROCEDURE:  The patient was monitored with a pulse oximetry, BP cuff, and EKG.  The patient has nasal O2 flowing during the procedure. Anesthesia was given by propofol supervised by a nurse anesthetist.   A flexible Olympus endoscope was passed down the throat without difficulty.  Findings include:   Esophagus:   Normal   GE junction at:  38 cm, no stricture or mucosal change was noted.   [photos were taken and should be under media]   Stomach and pylorus: Normal   Duodenum:   Normal  PLAN:  Follow up with office for weight loss surgery  Alphonsa Overall, MD, [facility] Office phone:  [phone]

## 2019-12-09 ENCOUNTER — Encounter (HOSPITAL_COMMUNITY): Payer: Self-pay | Admitting: Surgery

## 2020-01-27 ENCOUNTER — Encounter: Payer: BC Managed Care – PPO | Attending: Surgery | Admitting: Skilled Nursing Facility1

## 2020-01-27 ENCOUNTER — Other Ambulatory Visit: Payer: Self-pay

## 2020-01-27 DIAGNOSIS — E669 Obesity, unspecified: Secondary | ICD-10-CM | POA: Insufficient documentation

## 2020-01-27 NOTE — Progress Notes (Signed)
Pre-Operative Nutrition Class:  Appt start time: 9718   End time:  1830.  Patient was seen on 01/27/2020 for Pre-Operative Bariatric Surgery Education at [facility]    Pt states he has switched his surgery to the sleeve gastrectomy.   Weight: 290 pounds  The following the learning objectives were met by the patient during this course:  Identify Pre-Op Dietary Goals and will begin 2 weeks pre-operatively  Identify appropriate sources of fluids and proteins   State protein recommendations and appropriate sources pre and post-operatively  Identify Post-Operative Dietary Goals and will follow for 2 weeks post-operatively  Identify appropriate multivitamin and calcium sources  Describe the need for physical activity post-operatively and will follow MD recommendations  State when to call healthcare provider regarding medication questions or post-operative complications  Handouts given during class include:  Pre-Op Bariatric Surgery Diet Handout  Protein Shake Handout  Post-Op Bariatric Surgery Nutrition Handout  [facility] Program Information Flyer  Support Group Information Flyer  [facility] Bariatric Supplements Price List  Follow-Up Plan: Patient will follow-up at [facility] 2 weeks post operatively for diet advancement per MD.

## 2020-02-14 NOTE — Progress Notes (Signed)
DUE TO COVID-19 ONLY ONE VISITOR IS ALLOWED TO COME WITH YOU AND STAY IN THE WAITING ROOM ONLY DURING PRE OP AND PROCEDURE DAY OF SURGERY. THE 1 VISITOR  MAY VISIT WITH YOU AFTER SURGERY IN YOUR PRIVATE ROOM DURING VISITING HOURS ONLY!  YOU NEED TO HAVE A COVID 19 TEST ON__8/27/21 300pm_____ @_______ , THIS TEST MUST BE DONE BEFORE SURGERY,  COVID TESTING SITE [address] [place] [id number], IT IS ON THE RIGHT GOING OUT [address] APPROXIMATELY  2 MINUTES PAST [employer] ON THE RIGHT. ONCE YOUR COVID TEST IS COMPLETED,  PLEASE BEGIN THE QUARANTINE INSTRUCTIONS AS OUTLINED IN YOUR HANDOUT.                Benjamin Pearson  02/14/2020   Your procedure is scheduled on:  02/25/20   Report to [facility] Main  Entrance   Report to admitting     [phone]     Call this number if you have problems the morning of surgery [phone]    REMEMBER: NO  SOLID FOOD CANDY OR GUM AFTER MIDNIGHT. CLEAR LIQUIDS UNTIL   0800am      . NOTHING BY MOUTH EXCEPT CLEAR LIQUIDS UNTIL    . PLEASE FINISH ENSURE DRINK PER SURGEON ORDER  WHICH NEEDS TO BE COMPLETED AT   0800am   .      CLEAR LIQUID DIET   Foods Allowed                                                                    Coffee and tea, regular and decaf                            Fruit ices (not with fruit pulp)                                      Iced Popsicles                                    Carbonated beverages, regular and diet                                    Cranberry, grape and apple juices Sports drinks like Gatorade Lightly seasoned clear broth or consume(fat free) Sugar, honey syrup ___________________________________________________________________      BRUSH YOUR TEETH MORNING OF SURGERY AND RINSE YOUR MOUTH OUT, NO CHEWING GUM CANDY OR MINTS.     Take these medicines the morning of surgery with A SIP OF WATER: none                                 You may not have any metal on your body  including hair pins and              piercings  Do not wear jewelry, make-up, lotions, powders or perfumes, deodorant  Do not wear nail polish on your fingernails.  Do not shave  48 hours prior to surgery.              Men may shave face and neck.   Do not bring valuables to the hospital. [place].  Contacts, dentures or bridgework may not be worn into surgery.  Leave suitcase in the car. After surgery it may be brought to your room.     Patients discharged the day of surgery will not be allowed to drive home. IF YOU ARE HAVING SURGERY AND GOING HOME THE SAME DAY, YOU MUST HAVE AN ADULT TO DRIVE YOU HOME AND BE WITH YOU FOR 24 HOURS. YOU MAY GO HOME BY TAXI OR UBER OR ORTHERWISE, BUT AN ADULT MUST ACCOMPANY YOU HOME AND STAY WITH YOU FOR 24 HOURS.  Name and phone number of your driver:               Please read over the following fact sheets you were given: _____________________________________________________________________  [facility] - Preparing for Surgery Before surgery, you can play an important role.  Because skin is not sterile, your skin needs to be as free of germs as possible.  You can reduce the number of germs on your skin by washing with CHG (chlorahexidine gluconate) soap before surgery.  CHG is an antiseptic cleaner which kills germs and bonds with the skin to continue killing germs even after washing. Please DO NOT use if you have an allergy to CHG or antibacterial soaps.  If your skin becomes reddened/irritated stop using the CHG and inform your nurse when you arrive at Short Stay. Do not shave (including legs and underarms) for at least 48 hours prior to the first CHG shower.  You may shave your face/neck. Please follow these instructions carefully:  1.  Shower with CHG Soap the night before surgery and the  morning of Surgery.  2.  If you choose to wash your hair, wash your hair first as usual with your  normal   shampoo.  3.  After you shampoo, rinse your hair and body thoroughly to remove the  shampoo.                           4.  Use CHG as you would any other liquid soap.  You can apply chg directly  to the skin and wash                       Gently with a scrungie or clean washcloth.  5.  Apply the CHG Soap to your body ONLY FROM THE NECK DOWN.   Do not use on face/ open                           Wound or open sores. Avoid contact with eyes, ears mouth and genitals (private parts).                       Wash face,  Genitals (private parts) with your normal soap.             6.  Wash thoroughly, paying special attention to the area where your surgery  will be performed.  7.  Thoroughly rinse your body with warm water from  the neck down.  8.  DO NOT shower/wash with your normal soap after using and rinsing off  the CHG Soap.                9.  Pat yourself dry with a clean towel.            10.  Wear clean pajamas.            11.  Place clean sheets on your bed the night of your first shower and do not  sleep with pets. Day of Surgery : Do not apply any lotions/deodorants the morning of surgery.  Please wear clean clothes to the hospital/surgery center.  FAILURE TO FOLLOW THESE INSTRUCTIONS MAY RESULT IN THE CANCELLATION OF YOUR SURGERY PATIENT SIGNATURE_________________________________  NURSE SIGNATURE__________________________________  ________________________________________________________________________

## 2020-02-15 ENCOUNTER — Other Ambulatory Visit: Payer: Self-pay | Admitting: Surgery

## 2020-02-17 ENCOUNTER — Other Ambulatory Visit: Payer: Self-pay

## 2020-02-17 ENCOUNTER — Encounter (HOSPITAL_COMMUNITY): Payer: Self-pay

## 2020-02-17 ENCOUNTER — Encounter (HOSPITAL_COMMUNITY)
Admission: RE | Admit: 2020-02-17 | Discharge: 2020-02-17 | Disposition: A | Discharge status: Home / Self Care | Payer: BC Managed Care – PPO | Source: Ambulatory Visit | Attending: Surgery | Admitting: Surgery

## 2020-02-17 DIAGNOSIS — Z01812 Encounter for preprocedural laboratory examination: Secondary | ICD-10-CM | POA: Diagnosis not present

## 2020-02-17 HISTORY — DX: Malignant (primary) neoplasm, unspecified: C80.1

## 2020-02-17 LAB — COMPREHENSIVE METABOLIC PANEL
ALT: 71 U/L — ABNORMAL HIGH (ref 0–44)
AST: 42 U/L — ABNORMAL HIGH (ref 15–41)
Albumin: 4.8 g/dL (ref 3.5–5.0)
Alkaline Phosphatase: 99 U/L (ref 38–126)
Anion gap: 11 (ref 5–15)
BUN: 21 mg/dL — ABNORMAL HIGH (ref 6–20)
CO2: 25 mmol/L (ref 22–32)
Calcium: 9.2 mg/dL (ref 8.9–10.3)
Chloride: 104 mmol/L (ref 98–111)
Creatinine, Ser: 0.91 mg/dL (ref 0.61–1.24)
GFR calc Af Amer: >60 mL/min (ref >60)
GFR calc non Af Amer: >60 mL/min (ref >60)
Glucose, Bld: 109 mg/dL — ABNORMAL HIGH (ref 70–99)
Potassium: 3.9 mmol/L (ref 3.5–5.1)
Sodium: 140 mmol/L (ref 135–145)
Total Bilirubin: 0.4 mg/dL (ref 0.3–1.2)
Total Protein: 7.6 g/dL (ref 6.5–8.1)

## 2020-02-17 LAB — CBC
HCT: 44.5 % (ref 39.0–52.0)
Hemoglobin: 14.6 g/dL (ref 13.0–17.0)
MCH: 30.4 pg (ref 26.0–34.0)
MCHC: 32.8 g/dL (ref 30.0–36.0)
MCV: 92.5 fL (ref 80.0–100.0)
Platelets: 311 10*3/uL (ref 150–400)
RBC: 4.81 MIL/uL (ref 4.22–5.81)
RDW: 12.1 % (ref 11.5–15.5)
WBC: 8.7 10*3/uL (ref 4.0–10.5)
nRBC: 0 % (ref 0.0–0.2)

## 2020-02-21 ENCOUNTER — Other Ambulatory Visit (HOSPITAL_COMMUNITY)
Admission: RE | Admit: 2020-02-21 | Discharge: 2020-02-21 | Disposition: A | Discharge status: Home / Self Care | Payer: BC Managed Care – PPO | Source: Ambulatory Visit | Attending: Surgery | Admitting: Surgery

## 2020-02-21 DIAGNOSIS — Z20822 Contact with and (suspected) exposure to covid-19: Secondary | ICD-10-CM | POA: Diagnosis not present

## 2020-02-21 DIAGNOSIS — Z01812 Encounter for preprocedural laboratory examination: Secondary | ICD-10-CM | POA: Insufficient documentation

## 2020-02-21 LAB — SARS CORONAVIRUS 2 (TAT 6-24 HRS): SARS Coronavirus 2: NEGATIVE

## 2020-02-23 NOTE — H&P (Signed)
Benjamin Pearson  Location: [facility] Patient #: [id number] DOB: 1980/09/25 Married / Language: English / Race: White Male  History of Present Illness   The patient is a 39 year old male who presents with a complaint of weight loss surgery.  The PCP is Charlyne Mom, [place] [phone])  He comes by himself.  This is considered a pre op visit, in that he has completed all his pre op work up. He has changed his planned surgery to a lap sleeve gastrectomy. I went over the sleeve gastrectomy preoperative, perioperative, and postop course. We talked about early and late complications. I think he has a good handle on the operation. I gave him another book on weight loss surgery. His goal weight is about 200 pounds.  UGI - 10/14/2019 - esophageal narrowing They suggested an upper endo. I did this on 12/06/2019 - this was normal. Nutrition - saw Sandie Ano on 01/27/2020 Psych - Dr. Ardath Sax - 05/31/2019  Plan: 1. To schedule laparoscopic sleeve gastrectomy, 2. EMMI video and consent for sleeve gastrectomy, 3. Send in Protonix and Zofran for post op use  History of weight problems: The patient is interested in weight loss surgery. He moved from [place] to [place] about 2 years ago. He said that he knew of several people in [place] who had had a lap band and this was still commonly performed down there. His mother-in-law had a lap band over duodenal switch. She has done well with this. His wife has had a gastric bypass. Larena Glassman he was interested in a lap band, but now is interested in the lap sleeve gastrectomy He has tried multiple diets to lose weight. He tried Weight Watchers, cell phone apps to follow, and a low-calorie diets. He was most successful lose about 80 pounds when he could jog every day and had a calorie intake of about 1000 cal. He now has 3 children which makes that much of her. He's also been on and off phentermine  several times.  Per the [facility], the patient is a candidate for lap sleeve gastrectomy. The patient listened to our online information session and reviewed the different types of bariatric surgery.  The patient is interested in the laparoscopic adjustable gastric band. I discussed with the patient the indications and risks of lap band surgery. The potential risks of surgery include, but are not limited to, bleeding, infection, DVT and PE, slippage and erosion of the band, open surgery, and death. The patient understands the importance of compliance and long term follow-up with our group after surgery.  Review of Systems as stated in this history (HPI) or in the review of systems. Otherwise all other 12 point ROS are negative  Past Medical History: 1. Interested in weight loss surgery weight - 299, BMI - 43.8 2. Elevated cholesterol 3. Has not taken the Covid vaccine 4. Psych - Dr. Ardath Sax  Social History: Married. He wife had a gastric bypass - she has some vitamin issues. He has 3 children: 42 yo, 59 yo, and 28 yo. Her works for Engineer, technical sales for [employer]. He works in [place] - so he is used to driving 90 minutes to work, so follow up here for lap band fills would not bother his.  Allergies (Chanel Teressa Senter, CMA; 01/30/2020 3:53 PM) No Known Drug Allergies  [05/10/2019]: Allergies Reconciled   Medication History (Chanel Teressa Senter, [place]; 01/30/2020 3:53 PM) No Current Medications Medications Reconciled  Vitals (Chanel Nolan CMA; 01/30/2020 3:53 PM) 01/30/2020 3:53 PM  Weight: 299.13 lb Height: 69.25in Body Surface Area: 2.46 m Body Mass Index: 43.85 kg/m  Temp.: 97.74F  Pulse: 100 (Regular)  BP: 132/80(Sitting, Left Arm, Standard)   Physical Exam  General: Obese WN WM who is alert and generally healthy appearing. He is wearing a mask. Skin: Inspection and palpation of the skin unremarkable.  Eyes: Conjunctivae white, pupils  equal. Face, ears, nose, mouth, and throat: Face - mask  Neck: Supple. No mass. Trachea midline. No thyroid mass.  Lymph Nodes: No supraclavicular or cervical adenopathy.  Lungs: Normal respiratory effort. Clear to auscultation and symmetric breath sounds. Cardiovascular: Regular rate and rhythm. Normal auscultation of the heart. No murmur or rub.  Abdomen: Soft. No mass. Liver and spleen not palpable. No tenderness. No hernia. Normal bowel sounds. No abdominal scars. He is about 1/2 apple and 1/2 pear. He does have a mild pannus. Rectal: Not done.  Musculoskeletal/extremities: Normal gait. Good strength and ROM in upper and lower extremities.    Assessment & Plan  1.  MORBID OBESITY (E66.01)  Plan  1. To schedule laparoscopic sleeve gastrectomy  2. EMMI video and consent for sleeve gastrectomy  3. Send in Protonix and Zofran for post op use  2.  UGI suggesting changes in esophagus          Upper endo on 12/06/2019 was normal   Alphonsa Overall, MD, [facility] Surgery Office phone:  [phone]

## 2020-02-24 MED ORDER — BUPIVACAINE LIPOSOME 1.3 % IJ SUSP
20.0000 mL | Freq: Once | INTRAMUSCULAR | Status: DC
  Filled 2020-02-24: qty 20

## 2020-02-24 NOTE — Anesthesia Preprocedure Evaluation (Addendum)
Anesthesia Evaluation  Patient identified by MRN, date of birth, ID band Patient awake    Reviewed: Allergy & Precautions, NPO status , Patient's Chart, lab work & pertinent test results  Airway Mallampati: III  TM Distance: >3 FB Neck ROM: Full    Dental  (+) Teeth Intact, Dental Advisory Given   Pulmonary former smoker,  Quit smoking 2009, no inhalers   Pulmonary exam normal breath sounds clear to auscultation       Cardiovascular negative cardio ROS Normal cardiovascular exam Rhythm:Regular Rate:Normal     Neuro/Psych negative neurological ROS  negative psych ROS   GI/Hepatic Neg liver ROS, GERD  Medicated and Controlled,  Endo/Other  Morbid obesityBMI 43  Renal/GU negative Renal ROS  negative genitourinary   Musculoskeletal negative musculoskeletal ROS (+)   Abdominal (+) + obese,   Peds  Hematology negative hematology ROS (+) hct 44.5, plt 311   Anesthesia Other Findings   Reproductive/Obstetrics negative OB ROS                            Anesthesia Physical Anesthesia Plan  ASA: III  Anesthesia Plan: General   Post-op Pain Management:    Induction: Intravenous  PONV Risk Score and Plan: 2 and Ondansetron, Dexamethasone, Midazolam and Treatment may vary due to age or medical condition  Airway Management Planned: Oral ETT  Additional Equipment: None  Intra-op Plan:   Post-operative Plan: Extubation in OR  Informed Consent: I have reviewed the patients History and Physical, chart, labs and discussed the procedure including the risks, benefits and alternatives for the proposed anesthesia with the patient or authorized representative who has indicated his/her understanding and acceptance.     Dental advisory given  Plan Discussed with: CRNA  Anesthesia Plan Comments:        Anesthesia Quick Evaluation

## 2020-02-25 ENCOUNTER — Inpatient Hospital Stay (HOSPITAL_COMMUNITY)
Admission: RE | Admit: 2020-02-25 | Discharge: 2020-02-27 | DRG: 621 | Disposition: A | Discharge status: Home / Self Care | Payer: BC Managed Care – PPO | Attending: Surgery | Admitting: Surgery

## 2020-02-25 ENCOUNTER — Inpatient Hospital Stay (HOSPITAL_COMMUNITY): Payer: BC Managed Care – PPO | Admitting: Anesthesiology

## 2020-02-25 ENCOUNTER — Other Ambulatory Visit: Payer: Self-pay

## 2020-02-25 ENCOUNTER — Encounter (HOSPITAL_COMMUNITY): Payer: Self-pay | Admitting: Surgery

## 2020-02-25 ENCOUNTER — Encounter (HOSPITAL_COMMUNITY)
Admission: RE | Disposition: A | Discharge status: Home / Self Care | Payer: Self-pay | Source: Home / Self Care | Attending: Surgery

## 2020-02-25 DIAGNOSIS — R110 Nausea: Secondary | ICD-10-CM | POA: Diagnosis not present

## 2020-02-25 DIAGNOSIS — Z6841 Body Mass Index (BMI) 40.0 and over, adult: Secondary | ICD-10-CM | POA: Diagnosis not present

## 2020-02-25 DIAGNOSIS — E7800 Pure hypercholesterolemia, unspecified: Secondary | ICD-10-CM | POA: Diagnosis present

## 2020-02-25 DIAGNOSIS — Z87891 Personal history of nicotine dependence: Secondary | ICD-10-CM | POA: Diagnosis not present

## 2020-02-25 DIAGNOSIS — K219 Gastro-esophageal reflux disease without esophagitis: Secondary | ICD-10-CM | POA: Diagnosis present

## 2020-02-25 HISTORY — PX: UPPER GI ENDOSCOPY: SHX6162

## 2020-02-25 HISTORY — PX: LAPAROSCOPIC GASTRIC SLEEVE RESECTION: SHX5895

## 2020-02-25 LAB — ABO/RH: ABO/RH(D): B POS

## 2020-02-25 LAB — TYPE AND SCREEN
ABO/RH(D): B POS
Antibody Screen: NEGATIVE

## 2020-02-25 LAB — HEMOGLOBIN AND HEMATOCRIT, BLOOD
HCT: 44.1 % (ref 39.0–52.0)
Hemoglobin: 14.7 g/dL (ref 13.0–17.0)

## 2020-02-25 SURGERY — GASTRECTOMY, SLEEVE, LAPAROSCOPIC
Anesthesia: General

## 2020-02-25 MED ORDER — ONDANSETRON HCL 4 MG/2ML IJ SOLN
INTRAMUSCULAR | Status: DC | PRN
  Administered 2020-02-25: 4 mg via INTRAVENOUS

## 2020-02-25 MED ORDER — SODIUM CHLORIDE (PF) 0.9 % IJ SOLN
INTRAMUSCULAR | Status: AC
  Filled 2020-02-25: qty 20

## 2020-02-25 MED ORDER — PROPOFOL 10 MG/ML IV BOLUS
INTRAVENOUS | Status: AC
  Filled 2020-02-25: qty 40

## 2020-02-25 MED ORDER — 0.9 % SODIUM CHLORIDE (POUR BTL) OPTIME
TOPICAL | Status: DC | PRN
  Administered 2020-02-25: 1000 mL

## 2020-02-25 MED ORDER — BUPIVACAINE LIPOSOME 1.3 % IJ SUSP
INTRAMUSCULAR | Status: DC | PRN
  Administered 2020-02-25: 20 mL

## 2020-02-25 MED ORDER — HYDROMORPHONE HCL 1 MG/ML IJ SOLN: 0.2500 mg | INTRAMUSCULAR | Status: DC | PRN

## 2020-02-25 MED ORDER — OXYCODONE HCL 5 MG/5ML PO SOLN: 5.0000 mg | Freq: Once | ORAL | Status: DC | PRN

## 2020-02-25 MED ORDER — DEXAMETHASONE SODIUM PHOSPHATE 10 MG/ML IJ SOLN
INTRAMUSCULAR | Status: DC | PRN
  Administered 2020-02-25: 7 mg via INTRAVENOUS

## 2020-02-25 MED ORDER — KETOROLAC TROMETHAMINE 30 MG/ML IJ SOLN: 30.0000 mg | Freq: Once | INTRAMUSCULAR | Status: DC | PRN

## 2020-02-25 MED ORDER — HYDROMORPHONE HCL 2 MG/ML IJ SOLN
INTRAMUSCULAR | Status: AC
  Filled 2020-02-25: qty 1

## 2020-02-25 MED ORDER — PROPOFOL 10 MG/ML IV BOLUS
INTRAVENOUS | Status: DC | PRN
  Administered 2020-02-25: 30 mg via INTRAVENOUS
  Administered 2020-02-25: 200 mg via INTRAVENOUS

## 2020-02-25 MED ORDER — PANTOPRAZOLE SODIUM 40 MG IV SOLR
40.0000 mg | Freq: Every day | INTRAVENOUS | Status: DC
  Administered 2020-02-25 – 2020-02-26 (×2): 40 mg via INTRAVENOUS
  Filled 2020-02-25 (×2): qty 40

## 2020-02-25 MED ORDER — ORAL CARE MOUTH RINSE: 15.0000 mL | Freq: Once | OROMUCOSAL | Status: AC

## 2020-02-25 MED ORDER — LACTATED RINGERS IV SOLN: INTRAVENOUS | Status: DC

## 2020-02-25 MED ORDER — LIDOCAINE 2% (20 MG/ML) 5 ML SYRINGE
INTRAMUSCULAR | Status: DC | PRN
  Administered 2020-02-25: 60 mg via INTRAVENOUS

## 2020-02-25 MED ORDER — MIDAZOLAM HCL 2 MG/2ML IJ SOLN
INTRAMUSCULAR | Status: AC
  Filled 2020-02-25: qty 2

## 2020-02-25 MED ORDER — ACETAMINOPHEN 500 MG PO TABS
1000.0000 mg | ORAL_TABLET | ORAL | Status: AC
  Administered 2020-02-25: 1000 mg via ORAL
  Filled 2020-02-25: qty 2

## 2020-02-25 MED ORDER — KETAMINE HCL 10 MG/ML IJ SOLN
INTRAMUSCULAR | Status: DC | PRN
  Administered 2020-02-25: 50 mg via INTRAVENOUS

## 2020-02-25 MED ORDER — ROCURONIUM BROMIDE 10 MG/ML (PF) SYRINGE
PREFILLED_SYRINGE | INTRAVENOUS | Status: DC | PRN
  Administered 2020-02-25: 100 mg via INTRAVENOUS

## 2020-02-25 MED ORDER — EPHEDRINE 5 MG/ML INJ
INTRAVENOUS | Status: AC
  Filled 2020-02-25: qty 10

## 2020-02-25 MED ORDER — MORPHINE SULFATE (PF) 4 MG/ML IV SOLN: 1.0000 mg | INTRAVENOUS | Status: DC | PRN

## 2020-02-25 MED ORDER — CHLORHEXIDINE GLUCONATE 4 % EX LIQD: 60.0000 mL | Freq: Once | CUTANEOUS | Status: DC

## 2020-02-25 MED ORDER — EPHEDRINE SULFATE-NACL 50-0.9 MG/10ML-% IV SOSY
PREFILLED_SYRINGE | INTRAVENOUS | Status: DC | PRN
  Administered 2020-02-25 (×2): 5 mg via INTRAVENOUS

## 2020-02-25 MED ORDER — BUPIVACAINE-EPINEPHRINE (PF) 0.25% -1:200000 IJ SOLN
INTRAMUSCULAR | Status: AC
  Filled 2020-02-25: qty 30

## 2020-02-25 MED ORDER — ACETAMINOPHEN 500 MG PO TABS: 1000.0000 mg | ORAL_TABLET | Freq: Once | ORAL | Status: DC

## 2020-02-25 MED ORDER — STERILE WATER FOR IRRIGATION IR SOLN
Status: DC | PRN
  Administered 2020-02-25: 2000 mL

## 2020-02-25 MED ORDER — SODIUM CHLORIDE 0.9 % IV SOLN
2.0000 g | INTRAVENOUS | Status: AC
  Administered 2020-02-25: 2 g via INTRAVENOUS
  Filled 2020-02-25: qty 2

## 2020-02-25 MED ORDER — LACTATED RINGERS IR SOLN
Status: DC | PRN
  Administered 2020-02-25: 1000 mL

## 2020-02-25 MED ORDER — MIDAZOLAM HCL 5 MG/5ML IJ SOLN
INTRAMUSCULAR | Status: DC | PRN
  Administered 2020-02-25: 2 mg via INTRAVENOUS

## 2020-02-25 MED ORDER — SUFENTANIL CITRATE 50 MCG/ML IV SOLN
INTRAVENOUS | Status: DC | PRN
  Administered 2020-02-25 (×3): 10 ug via INTRAVENOUS
  Administered 2020-02-25: 5 ug via INTRAVENOUS
  Administered 2020-02-25 (×4): 10 ug via INTRAVENOUS

## 2020-02-25 MED ORDER — HYDROMORPHONE HCL 1 MG/ML IJ SOLN
INTRAMUSCULAR | Status: DC | PRN
  Administered 2020-02-25: 1 mg via INTRAVENOUS

## 2020-02-25 MED ORDER — OXYCODONE HCL 5 MG PO TABS: 5.0000 mg | ORAL_TABLET | Freq: Once | ORAL | Status: DC | PRN

## 2020-02-25 MED ORDER — ONDANSETRON HCL 4 MG/2ML IJ SOLN
4.0000 mg | INTRAMUSCULAR | Status: DC | PRN
  Administered 2020-02-25 – 2020-02-26 (×6): 4 mg via INTRAVENOUS
  Filled 2020-02-25 (×7): qty 2

## 2020-02-25 MED ORDER — BUPIVACAINE-EPINEPHRINE 0.25% -1:200000 IJ SOLN
INTRAMUSCULAR | Status: DC | PRN
  Administered 2020-02-25: 30 mL

## 2020-02-25 MED ORDER — SUFENTANIL CITRATE 50 MCG/ML IV SOLN
INTRAVENOUS | Status: AC
  Filled 2020-02-25: qty 1

## 2020-02-25 MED ORDER — KCL IN DEXTROSE-NACL 20-5-0.45 MEQ/L-%-% IV SOLN
INTRAVENOUS | Status: DC
  Filled 2020-02-25 (×5): qty 1000

## 2020-02-25 MED ORDER — SUGAMMADEX SODIUM 500 MG/5ML IV SOLN
INTRAVENOUS | Status: AC
  Filled 2020-02-25: qty 5

## 2020-02-25 MED ORDER — ENOXAPARIN SODIUM 30 MG/0.3ML ~~LOC~~ SOLN
30.0000 mg | Freq: Two times a day (BID) | SUBCUTANEOUS | Status: DC
  Administered 2020-02-25 – 2020-02-26 (×3): 30 mg via SUBCUTANEOUS
  Filled 2020-02-25 (×3): qty 0.3

## 2020-02-25 MED ORDER — ENSURE MAX PROTEIN PO LIQD
2.0000 [oz_av] | ORAL | Status: DC
  Administered 2020-02-26 – 2020-02-27 (×8): 2 [oz_av] via ORAL

## 2020-02-25 MED ORDER — ONDANSETRON HCL 4 MG/2ML IJ SOLN: 4.0000 mg | Freq: Once | INTRAMUSCULAR | Status: DC | PRN

## 2020-02-25 MED ORDER — OXYCODONE HCL 5 MG/5ML PO SOLN
5.0000 mg | Freq: Four times a day (QID) | ORAL | Status: DC | PRN
  Administered 2020-02-25 – 2020-02-26 (×4): 5 mg via ORAL
  Filled 2020-02-25 (×4): qty 5

## 2020-02-25 MED ORDER — SUGAMMADEX SODIUM 500 MG/5ML IV SOLN
INTRAVENOUS | Status: DC | PRN
  Administered 2020-02-25: 300 mg via INTRAVENOUS

## 2020-02-25 MED ORDER — DEXAMETHASONE SODIUM PHOSPHATE 10 MG/ML IJ SOLN
INTRAMUSCULAR | Status: AC
  Filled 2020-02-25: qty 1

## 2020-02-25 MED ORDER — TISSEEL VH 10 ML EX KIT
PACK | CUTANEOUS | Status: DC | PRN
  Administered 2020-02-25: 1

## 2020-02-25 MED ORDER — SCOPOLAMINE 1 MG/3DAYS TD PT72
1.0000 | MEDICATED_PATCH | TRANSDERMAL | Status: DC
  Administered 2020-02-25: 1.5 mg via TRANSDERMAL
  Filled 2020-02-25: qty 1

## 2020-02-25 MED ORDER — ACETAMINOPHEN 500 MG PO TABS
1000.0000 mg | ORAL_TABLET | Freq: Three times a day (TID) | ORAL | Status: DC
  Administered 2020-02-26 – 2020-02-27 (×2): 1000 mg via ORAL
  Filled 2020-02-25 (×3): qty 2

## 2020-02-25 MED ORDER — ONDANSETRON HCL 4 MG/2ML IJ SOLN
INTRAMUSCULAR | Status: AC
  Filled 2020-02-25: qty 2

## 2020-02-25 MED ORDER — LIDOCAINE 2% (20 MG/ML) 5 ML SYRINGE
INTRAMUSCULAR | Status: DC | PRN
  Administered 2020-02-25: 1.5 mg/kg/h via INTRAVENOUS

## 2020-02-25 MED ORDER — ACETAMINOPHEN 160 MG/5ML PO SOLN
1000.0000 mg | Freq: Three times a day (TID) | ORAL | Status: DC
  Administered 2020-02-25 – 2020-02-26 (×3): 1000 mg via ORAL
  Filled 2020-02-25 (×3): qty 40.6

## 2020-02-25 MED ORDER — CHLORHEXIDINE GLUCONATE 0.12 % MT SOLN
15.0000 mL | Freq: Once | OROMUCOSAL | Status: AC
  Administered 2020-02-25: 15 mL via OROMUCOSAL

## 2020-02-25 SURGICAL SUPPLY — 54 items
APPLIER CLIP ROT 13.4 12 LRG (CLIP) ×2
BLADE SURG 15 STRL LF DISP TIS (BLADE) ×1 IMPLANT
BLADE SURG 15 STRL SS (BLADE) ×1
CABLE HIGH FREQUENCY MONO STRZ (ELECTRODE) IMPLANT
CLIP APPLIE ROT 13.4 12 LRG (CLIP) ×1 IMPLANT
COVER WAND RF STERILE (DRAPES) ×2 IMPLANT
DECANTER SPIKE VIAL GLASS SM (MISCELLANEOUS) ×2 IMPLANT
DERMABOND ADVANCED (GAUZE/BANDAGES/DRESSINGS) ×1
DERMABOND ADVANCED .7 DNX12 (GAUZE/BANDAGES/DRESSINGS) ×1 IMPLANT
DEVICE SUT QUICK LOAD TK 5 (STAPLE) IMPLANT
DEVICE SUT TI-KNOT TK 5X26 (MISCELLANEOUS) IMPLANT
DEVICE SUTURE ENDOST 10MM (ENDOMECHANICALS) IMPLANT
DISSECTOR BLUNT TIP ENDO 5MM (MISCELLANEOUS) IMPLANT
DURAPREP 26ML APPLICATOR (WOUND CARE) ×4 IMPLANT
ELECT REM PT RETURN 15FT ADLT (MISCELLANEOUS) ×2 IMPLANT
GLOVE SURG SYN 7.5  E (GLOVE) ×1
GLOVE SURG SYN 7.5 E (GLOVE) ×1 IMPLANT
GOWN STRL REUS W/TWL XL LVL3 (GOWN DISPOSABLE) ×6 IMPLANT
GRASPER SUT TROCAR 14GX15 (MISCELLANEOUS) IMPLANT
KIT BASIN OR (CUSTOM PROCEDURE TRAY) ×2 IMPLANT
KIT TURNOVER KIT A (KITS) ×2 IMPLANT
MARKER SKIN DUAL TIP RULER LAB (MISCELLANEOUS) ×2 IMPLANT
MAT PREVALON FULL STRYKER (MISCELLANEOUS) ×2 IMPLANT
NEEDLE SPNL 22GX3.5 QUINCKE BK (NEEDLE) ×2 IMPLANT
PACK CARDIOVASCULAR III (CUSTOM PROCEDURE TRAY) ×2 IMPLANT
RELOAD STAPLER BLUE 60MM (STAPLE) ×3 IMPLANT
RELOAD STAPLER GOLD 60MM (STAPLE) ×1 IMPLANT
RELOAD STAPLER GREEN 60MM (STAPLE) ×2 IMPLANT
SCISSORS LAP 5X35 DISP (ENDOMECHANICALS) IMPLANT
SEALANT SURGICAL APPL DUAL CAN (MISCELLANEOUS) ×2 IMPLANT
SET IRRIG TUBING LAPAROSCOPIC (IRRIGATION / IRRIGATOR) ×2 IMPLANT
SET TUBE SMOKE EVAC HIGH FLOW (TUBING) ×2 IMPLANT
SHEARS HARMONIC ACE PLUS 45CM (MISCELLANEOUS) ×2 IMPLANT
SLEEVE ADV FIXATION 5X100MM (TROCAR) ×4 IMPLANT
SLEEVE GASTRECTOMY 36FR VISIGI (MISCELLANEOUS) ×2 IMPLANT
SOL ANTI FOG 6CC (MISCELLANEOUS) ×1 IMPLANT
SOLUTION ANTI FOG 6CC (MISCELLANEOUS) ×1
SPONGE LAP 18X18 RF (DISPOSABLE) ×2 IMPLANT
STAPLER ECHELON LONG 60 440 (INSTRUMENTS) ×2 IMPLANT
STAPLER RELOAD BLUE 60MM (STAPLE) ×6
STAPLER RELOAD GOLD 60MM (STAPLE) ×2
STAPLER RELOAD GREEN 60MM (STAPLE) ×4
SUT MNCRL AB 4-0 PS2 18 (SUTURE) ×2 IMPLANT
SUT SURGIDAC NAB ES-9 0 48 120 (SUTURE) IMPLANT
SUT VICRYL 0 TIES 12 18 (SUTURE) IMPLANT
SYR 10ML ECCENTRIC (SYRINGE) ×2 IMPLANT
SYR CONTROL 10ML LL (SYRINGE) ×2 IMPLANT
TOWEL OR 17X26 10 PK STRL BLUE (TOWEL DISPOSABLE) ×2 IMPLANT
TOWEL OR NON WOVEN STRL DISP B (DISPOSABLE) ×2 IMPLANT
TROCAR ADV FIXATION 12X100MM (TROCAR) ×2 IMPLANT
TROCAR ADV FIXATION 5X100MM (TROCAR) ×2 IMPLANT
TROCAR BLADELESS 15MM (ENDOMECHANICALS) ×2 IMPLANT
TROCAR BLADELESS OPT 5 100 (ENDOMECHANICALS) ×2 IMPLANT
TUBING CONNECTING 10 (TUBING) ×2 IMPLANT

## 2020-02-25 NOTE — Anesthesia Postprocedure Evaluation (Signed)
Anesthesia Post Note  Patient: Benjamin Pearson  Procedure(s) Performed: LAPAROSCOPIC SLEEVE GASTRECTOMY (N/A ) UPPER GI ENDOSCOPY (N/A )     Patient location during evaluation: PACU Anesthesia Type: General Level of consciousness: awake and alert, oriented and patient cooperative Pain management: pain level controlled Vital Signs Assessment: post-procedure vital signs reviewed and stable Respiratory status: spontaneous breathing, nonlabored ventilation and respiratory function stable Cardiovascular status: blood pressure returned to baseline and stable Postop Assessment: no apparent nausea or vomiting Anesthetic complications: no   No complications documented.  Last Vitals:  Vitals:   02/25/20 1330 02/25/20 1433  BP: (!) 141/83 (!) 142/83  Pulse: (!) 59 (!) 58  Resp: 16 18  Temp: 36.8 C 36.8 C  SpO2: 92% 93%    Last Pain:  Vitals:   02/25/20 1433  TempSrc: Oral  PainSc:                  Pervis Hocking

## 2020-02-25 NOTE — Transfer of Care (Addendum)
Immediate Anesthesia Transfer of Care Note  Patient: Benjamin Pearson  Procedure(s) Performed: LAPAROSCOPIC SLEEVE GASTRECTOMY (N/A ) UPPER GI ENDOSCOPY (N/A )  Patient Location: PACU  Anesthesia Type:General  Level of Consciousness: awake, alert , oriented and patient cooperative  Airway & Oxygen Therapy: Patient Spontanous Breathing and Patient connected to face mask oxygen  Post-op Assessment: Report given to RN, Post -op Vital signs reviewed and stable and Patient moving all extremities X 4  Post vital signs: stable  Last Vitals:  Vitals Value Taken Time  BP 144/91 02/25/20 1230  Temp    Pulse 86 02/25/20 1233  Resp 12 02/25/20 1233  SpO2 100 % 02/25/20 1233  Vitals shown include unvalidated device data.  Last Pain:  Vitals:   02/25/20 1230  TempSrc:   PainSc: (P) 0-No pain         Complications: No complications documented.

## 2020-02-25 NOTE — Op Note (Signed)
Benjamin Pearson [id number] June 09, 1981 02/25/2020  Preoperative diagnosis: severe obesity  Postoperative diagnosis: Same   Procedure: upper endoscopy   Surgeon: Leighton Ruff. Auriana Scalia M.D., FACS   Anesthesia: Gen.   Indications for procedure: 39 y.o. year old male undergoing Laparoscopic Gastric Sleeve Resection and an EGD was requested to evaluate the new gastric sleeve.   Description of procedure: After we have completed the sleeve resection, I scrubbed out and obtained the Olympus endoscope. I gently placed endoscope in the patient's oropharynx and gently glided it down the esophagus without any difficulty under direct visualization. Once I was in the gastric sleeve, I insufflated the stomach with air. I was able to cannulate and advanced the scope through the gastric sleeve. I was able to cannulate the duodenum with ease. Dr. Lucia Gaskins had placed saline in the upper abdomen. Upon further insufflation of the gastric sleeve there was no evidence of bubbles. GE junction located at 39 cm. z line normal.  Upon further inspection of the gastric sleeve, the mucosa appeared normal. There is no evidence of any mucosal abnormality. The sleeve was widely patent at the angularis. There was no evidence of bleeding. The gastric sleeve was decompressed. The scope was withdrawn. The patient tolerated this portion of the procedure well. Please see Dr Pollie Friar operative note for details regarding the laparoscopic gastric sleeve resection.   Leighton Ruff. Redmond Pulling, MD, FACS  General, Bariatric, & Minimally Invasive Surgery  [facility] Surgery, [place]

## 2020-02-25 NOTE — Interval H&P Note (Signed)
History and Physical Interval Note:  02/25/2020 9:59 AM  Benjamin Pearson  has presented today for surgery, with the diagnosis of morbid obesity.  The various methods of treatment have been discussed with the patient and family.   His wife is here.  He has a hotel that they have reserved.  After consideration of risks, benefits and other options for treatment, the patient has consented to  Procedure(s): LAPAROSCOPIC SLEEVE GASTRECTOMY (N/A) UPPER GI ENDOSCOPY (N/A) as a surgical intervention.  The patient's history has been reviewed, patient examined, no change in status, stable for surgery.  I have reviewed the patient's chart and labs.  Questions were answered to the patient's satisfaction.     Shann Medal

## 2020-02-25 NOTE — Anesthesia Procedure Notes (Signed)
Procedure Name: Intubation Date/Time: 02/25/2020 10:38 AM Performed by: Lissa Morales, CRNA Pre-anesthesia Checklist: Patient identified, Emergency Drugs available, Suction available and Patient being monitored Patient Re-evaluated:Patient Re-evaluated prior to induction Oxygen Delivery Method: Circle system utilized Preoxygenation: Pre-oxygenation with 100% oxygen Induction Type: IV induction Ventilation: Mask ventilation without difficulty Laryngoscope Size: Mac and 4 Grade View: Grade II Tube type: Oral Tube size: 7.5 mm Number of attempts: 1 Airway Equipment and Method: Stylet and Oral airway Placement Confirmation: ETT inserted through vocal cords under direct vision,  positive ETCO2 and breath sounds checked- equal and bilateral Secured at: 22 cm Tube secured with: Tape Dental Injury: Teeth and Oropharynx as per pre-operative assessment

## 2020-02-25 NOTE — Discharge Instructions (Signed)
GASTRIC BYPASS/SLEEVE  Home Care Instructions   These instructions are to help you care for yourself when you go home.  Call: If you have any problems. . Call [phone] and ask for the surgeon on call . If you need immediate help, come to the ER at [facility].  . Tell the ER staff that you are a new post-op gastric bypass or gastric sleeve patient   Signs and symptoms to report: . Severe vomiting or nausea o If you cannot keep down clear liquids for longer than 1 day, call your surgeon  . Abdominal pain that does not get better after taking your pain medication . Fever over 100.4 F with chills . Heart beating over 100 beats a minute . Shortness of breath at rest . Chest pain .  Redness, swelling, drainage, or foul odor at incision (surgical) sites .  If your incisions open or pull apart . Swelling or pain in calf (lower leg) . Diarrhea (Loose bowel movements that happen often), frequent watery, uncontrolled bowel movements . Constipation, (no bowel movements for 3 days) if this happens: Pick one o Milk of Magnesia, 2 tablespoons by mouth, 3 times a day for 2 days if needed o Stop taking Milk of Magnesia once you have a bowel movement o Call your doctor if constipation continues Or o Miralax  (instead of Milk of Magnesia) following the label instructions o Stop taking Miralax once you have a bowel movement o Call your doctor if constipation continues . Anything you think is not normal   Normal side effects after surgery: . Unable to sleep at night or unable to focus . Irritability or moody . Being tearful (crying) or depressed These are common complaints, possibly related to your anesthesia medications that put you to sleep, stress of surgery, and change in lifestyle.  This usually goes away a few weeks after surgery.  If these feelings continue, call your primary care doctor.   Wound Care:  . Surgical glue:  Looks like a clear film over your incisions and will wear  off a little at a time  . Showering: You may shower two (2) days after your surgery unless your surgeon tells you differently o Wash gently around incisions with warm soapy water, rinse well, and gently pat dry  o No tub baths until staples are removed, steri-strips fall off or glue is gone.    Medications: Marland Kitchen Medications should be liquid or crushed if larger than the size of a dime . Extended release pills (medication that release a little bit at a time through the day) should NOT be crushed or cut. (examples include XL, ER, DR, SR) . Depending on the size and number of medications you take, you may need to space (take a few throughout the day)/change the time you take your medications so that you do not over-fill your pouch (smaller stomach) . Make sure you follow-up with your primary care doctor to make medication changes needed during rapid weight loss and life-style changes . If you have diabetes, follow up with the doctor that orders your diabetes medication(s) within one week after surgery and check your blood sugar regularly. . Do not drive while taking prescription pain medication  . It is ok to take Tylenol by the bottle instructions with your pain medicine or instead of your pain medicine as needed.  DO NOT TAKE NSAIDS (EXAMPLES OF NSAIDS:  IBUPROFREN/ NAPROXEN)  Diet:  First 2 Weeks  You will see the dietician t about two (2) weeks after your surgery. The dietician will increase the types of foods you can eat if you are handling liquids well: Marland Kitchen If you have severe vomiting or nausea and cannot keep down clear liquids lasting longer than 1 day, call your surgeon @ [phone]) Protein Shake . Drink at least 2 ounces of shake 5-6 times per day . Each serving of protein shakes (usually 8 - 12 ounces) should have: o 15 grams of protein  o And no more than 5 grams of carbohydrate  . Goal for protein each day: o Men = 80 grams per day o Women = 60 grams per  day . Protein powder may be added to fluids such as non-fat milk or Lactaid milk or unsweetened Soy/Almond milk (limit to 35 grams added protein powder per serving)  Hydration . Slowly increase the amount of water and other clear liquids as tolerated (See Acceptable Fluids) . Slowly increase the amount of protein shake as tolerated  .  Sip fluids slowly and throughout the day.  Do not use straws. . May use sugar substitutes in small amounts (no more than 6 - 8 packets per day; i.e. Splenda)  Fluid Goal . The first goal is to drink at least 8 ounces of protein shake/drink per day (or as directed by the nutritionist); some examples of protein shakes are Johnson [employer] Johnson, [employer]R [employer], EAS Edge HP, and Unjury. See handout from pre-op Bariatric Education Class: o Slowly increase the amount of protein shake you drink as tolerated o You may find it easier to slowly sip shakes throughout the day o It is important to get your proteins in first . Your fluid goal is to drink 64 - 100 ounces of fluid daily o It may take a few weeks to build up to this . 32 oz (or more) should be clear liquids  And  . 32 oz (or more) should be full liquids (see below for examples) . Liquids should not contain sugar, caffeine, or carbonation  Clear Liquids: . Water or Sugar-free flavored water (i.e. Fruit H2O, Propel) . Decaffeinated coffee or tea (sugar-free) . [employer], [employer], Minute [facility] . Sugar-free Jell-O . Bouillon or broth . Sugar-free Popsicle:   *Less than 20 calories each; Limit 1 per day  Full Liquids: Protein Shakes/Drinks + 2 choices per day of other full liquids . Full liquids must be: o No More Than 15 grams of Carbs per serving  o No More Than 3 grams of Fat per serving . Strained low-fat cream soup (except Cream of Potato or Tomato) . Non-Fat milk . Fat-free Lactaid Milk . Unsweetened Soy Or Unsweetened Almond Milk . Low Sugar yogurt (Dannon Lite & Fit, [place] yogurt; Oikos  Triple Zero; Chobani Simply 100; Yoplait 100 calorie [place] - No Fruit on the Bottom)    Vitamins and Minerals . Start 1 day after surgery unless otherwise directed by your surgeon . Chewable Bariatric Specific Multivitamin / Multimineral Supplement with iron (Example: Bariatric Advantage Multi EA) . Chewable Calcium with Vitamin D-3 (Example: 3 Chewable Calcium Plus 600 with Vitamin D-3) o Take 500 mg three (3) times a day for a total of 1500 mg each day o Do not take all 3 doses of calcium at one time as it may cause constipation, and you can only absorb 500 mg  at a time  o Do not mix multivitamins containing iron with calcium supplements; take 2  hours apart . Menstruating women and those with a history of anemia (a blood disease that causes weakness) may need extra iron o Talk with your doctor to see if you need more iron . Do not stop taking or change any vitamins or minerals until you talk to your dietitian or surgeon . Your Dietitian and/or surgeon must approve all vitamin and mineral supplements   Activity and Exercise: Limit your physical activity as instructed by your doctor.  It is important to continue walking at home.  During this time, use these guidelines: . Do not lift anything greater than ten (10) pounds for at least two (2) weeks . Do not go back to work or drive until Engineer, production says you can . You may have sex when you feel comfortable  o It is VERY important for male patients to use a reliable birth control method; fertility often increases after surgery  o All hormonal birth control will be ineffective for 30 days after surgery due to medications given during surgery a barrier method must be used. o Do not get pregnant for at least 18 months . Start exercising as soon as your doctor tells you that you can o Make sure your doctor approves any physical activity . Start with a simple walking program . Walk 5-15 minutes each day, 7 days per week.  . Slowly increase  until you are walking 30-45 minutes per day Consider joining our [place] program. [phone] or email [email or web address]   Special Instructions Things to remember: . Use your CPAP when sleeping if this applies to you  . [facility] has two free Bariatric Surgery Support Groups that meet monthly o The 3rd Thursday of each month, 6 pm o The 2nd Friday of each month, 11:30 . It is very important to keep all follow up appointments with your surgeon, dietitian, primary care physician, and behavioral health practitioner . Routine follow up schedule with your surgeon include appointments at 2-3 weeks, 6-8 weeks, 6 months, and 1 year at a minimum.  Your surgeon may request to see you more often.   . After the first year, please follow up with your bariatric surgeon and dietitian at least once a year in order to maintain best weight loss results .  [facility]: [place]: [phone] Bariatric Nurse Coordinator: [phone]      Reviewed and Endorsed  by [facility] Patient Education Committee, June, 2016 Edits Approved: Aug, 2018

## 2020-02-25 NOTE — Op Note (Addendum)
PATIENT:   Benjamin Pearson DOB:   [birth year] MRN:   [id number]  DATE OF PROCEDURE: 02/25/2020                   FACILITY:  [facility]  OPERATIVE REPORT  PREOPERATIVE DIAGNOSIS:  Morbid obesity.  POSTOPERATIVE DIAGNOSIS:  Morbid obesity (weight 299, BMI of 43.8).  PROCEDURE:  Laparoscopic sleeve gastrectomy (intraoperative upper endoscopy by Dr. Ok Anis)  SURGEON:  Fenton Malling. Lucia Gaskins, MD  FIRST ASSISTANTOk Anis, MD  ANESTHESIA:  General endotracheal.  Anesthesiologist: Pervis Hocking, DO CRNA: Lissa Morales, CRNA; Silas Sacramento, CRNA  General  ESTIMATED BLOOD LOSS:  Minimal.  LOCAL ANESTHESIA:  30 cc of 1/4% Marcaine and 20 cc of Exparel  COMPLICATIONS:  None.  INDICATION FOR SURGERY:  Benjamin Pearson is a 39 y.o. white male who sees Dr. Charlyne Pearson as her primary care doctor.  She has completed our preoperative bariatric program and now comes for a laparoscopic sleeve gastrectomy.  The indications, potential complications of surgery were explained to the patient.  Potential complications of the surgery include, but are not limited to, bleeding, infection, DVT, open surgery, and long-term nutritional consequences.  OPERATIVE NOTE:  The patient taken to room #1 at [facility] where [name] underwent a general endotracheal anesthetic, supervised by Anesthesiologist: Pervis Hocking, DO CRNA: Lissa Morales, CRNA; Silas Sacramento, CRNA.  The patient was given 2 g of cefotetan at the beginning of the procedure.  A time-out was held and surgical checklist run.  [name] through the left upper quadrant with a 5 mm Optiview. I did an abdominal exploration.   Her omentum and bowel were unremarkable. The right and left lobes of the liver unremarkable. Gallbladder was normal. Her stomach was unremarkable.   I placed a total of 5 trocars. I placed a 5 mm left lateral trocar, a 5 mm left paramedian trocar (for the scope), a 12 mm right paramedian  torcar, a 5 mm right subcostal trocar that I converted to a 15 mm to extract the stomach and 5 mm subxiphoid trocar for the liver retractor.  I placed a abdominal wall anesthetic block using a mixture of 1/4% Marcaine and Exparel.  I used 20 cc per side, for a total of 40 cc.  I started out taking down the greater curvature attachments of the stomach. I measured approximately 6 cm proximal from the pylorus and mobilized the greater curvature of the stomach with the Harmonic Scalpel. I took this dissection cranially around the greater curvature of her stomach to the angle of His and the left crus.   After I had mobilized the greater curvature of the stomach, I then passed the 36 French ViSiGi bougie which was used to suck the stomach up against the lesser curvature and the tip of the ViSiGi was placed into the antrum. During the staple firing,  I tried to give the [place] a cuff at least about 1 cm. I tried to avoid narrowing the incisura. I used a total of 6 staple firings.  From antrum to the angle of His, I used 2 green, 1 gold and 3 blue [place] 60 mm Ethicon staplers. I did not use staple line reinforcement.   At each firing of the EndoGIA stapler, I inspected the stomach, anterior wall of the stomach, and underneath to make sure there was no compromise or impingement on to the ViSiGi bougie.   The staple line seemed linear  without any corkscrewing of the stomach. Hemostasis was good. I did not use any reinforcement. [name] which I used clips to clip on the new greater curvature of the stomach.  Because I thought we had a good staple line, [name] to insufflate the pouch. The new stomach pouch was placed under water. There was no bubbling or leak noted.   At this point, Dr. Redmond Pulling broke scrub and passed an upper endoscope down through the esophagus into the stomach pouch. The stomach was tubular. There was no narrowing of the stomach  pouch or angulation. We were easily able to pass the endoscope into the antrum and again put air pressure on the staple line. I irrigated the upper abdomen with saline. There was no bubbling or evidence of air leak. The mucosa looked viable. Dr. Redmond Pulling decompressed the stomach with the endoscopy.   I converted to right subcostal trocar to a 15 trocar and extracted the stomach remnant through this intact and sent this to pathology. I then placed 10 cc of Tisseel along the new greater curvature staple line and covered the entire staple line with the Tisseel.  I aspirated out the saline that I had irrigated because I thought the staple line looked viable and complete. There was no evidence of leak. I did not leave a drain in place.   Then, I closed the trocar sites. I placed 0 Vicryl sutures at the 15-mm port site in the right upper quadrant. The other port sites seemed smaller not requiring sutures. I closed the skin at each site with a 4-0 Monocryl, painted each wound with Dermabond.   I have a surgeon as a first assist to retract, expose, and assist on this difficult operation.  The patient was transported to recovery room in good condition. Sponge and needle count were correct at the end of the case.    Left upper photo:  Sleeve gastrectomy and gastric remnant Right upper photo:  Gastric sleeve with tissell   Alphonsa Overall, MD, [facility] Surgery Pager: [phone] Office phone:  [phone]

## 2020-02-25 NOTE — Progress Notes (Signed)
Discussed post op day goals with patient including ambulation, IS, diet progression, pain, and nausea control.  BSTOP education provided including BSTOP information guide, "Guide for Pain Management after your Bariatric Procedure".  Questions answered. 

## 2020-02-25 NOTE — Progress Notes (Signed)
PHARMACY CONSULT FOR:  Risk Assessment for Post-Discharge VTE Following Bariatric Surgery  Post-Discharge VTE Risk Assessment: This patient's probability of 30-day post-discharge VTE is increased due to the factors marked: x  Male    Age >/=60 years    BMI >/=50 kg/m2    CHF    Dyspnea at Rest    Paraplegia  x  Non-gastric-band surgery    Operation Time >/=3 hr    Return to OR     Length of Stay >/= 3 d   Hx of VTE   Hypercoagulable condition   Significant venous stasis       Predicted probability of 30-day post-discharge VTE: 0.31%  Other patient-specific factors to consider: n/a   Recommendation for Discharge: No pharmacologic prophylaxis post-discharge     Benjamin Pearson is a 39 y.o. male who underwent laparoscopic sleeve gastrectomy on 02/25/20   Case start: 1056 Case end: 1221   No Known Allergies  Patient Measurements: Height: 5\' 9"  (175.3 cm) Weight: 126.6 kg (279 lb 3.2 oz) IBW/kg (Calculated) : 70.7 Body mass index is 41.23 kg/m.  No results for input(s): WBC, HGB, HCT, PLT, APTT, CREATININE, LABCREA, CREATININE, CREAT24HRUR, MG, PHOS, ALBUMIN, PROT, ALBUMIN, AST, ALT, ALKPHOS, BILITOT, BILIDIR, IBILI in the last 72 hours. Estimated Creatinine Clearance: 142.1 mL/min (by C-G formula based on SCr of 0.91 mg/dL).    Past Medical History:  Diagnosis Date  . Cancer ([place])    premelanoma on right shoulder   . Hyperlipidemia   . Obesity      Medications Prior to Admission  Medication Sig Dispense Refill Last Dose  . Ascorbic Acid (VITAMIN C PO) Take 1 tablet by mouth daily.   Past Month at Unknown time  . CALCIUM PO Take 1 tablet by mouth daily.   02/23/2020  . Cholecalciferol (VITAMIN D3 PO) Take 1 tablet by mouth daily.    Past Month at Unknown time  . ibuprofen (ADVIL) 200 MG tablet Take 200-400 mg by mouth every 8 (eight) hours as needed for headache or moderate pain.    More than a month at Unknown time  . Multiple Vitamins-Minerals  (BARIATRIC MULTIVITAMINS/IRON PO) Take 1 tablet by mouth daily.   02/23/2020  . ondansetron (ZOFRAN-ODT) 4 MG disintegrating tablet Take 4 mg by mouth every 6 (six) hours as needed.   not started  . pantoprazole (PROTONIX) 20 MG tablet Take 20 mg by mouth daily.   not started  . Polyvinyl Alcohol-Povidone (REFRESH OP) Place 1 drop into both eyes daily as needed (dry/irritated eyes.).    More than a month at Unknown time       Benjamin Pearson 02/25/2020,1:33 PM

## 2020-02-26 ENCOUNTER — Encounter (HOSPITAL_COMMUNITY): Payer: Self-pay | Admitting: Surgery

## 2020-02-26 LAB — CBC WITH DIFFERENTIAL/PLATELET
Abs Immature Granulocytes: 0.05 10*3/uL (ref 0.00–0.07)
Basophils Absolute: 0 10*3/uL (ref 0.0–0.1)
Basophils Relative: 0 %
Eosinophils Absolute: 0 10*3/uL (ref 0.0–0.5)
Eosinophils Relative: 0 %
HCT: 44.6 % (ref 39.0–52.0)
Hemoglobin: 14.4 g/dL (ref 13.0–17.0)
Immature Granulocytes: 0 %
Lymphocytes Relative: 15 %
Lymphs Abs: 2.1 10*3/uL (ref 0.7–4.0)
MCH: 30.7 pg (ref 26.0–34.0)
MCHC: 32.3 g/dL (ref 30.0–36.0)
MCV: 95.1 fL (ref 80.0–100.0)
Monocytes Absolute: 1.6 10*3/uL — ABNORMAL HIGH (ref 0.1–1.0)
Monocytes Relative: 11 %
Neutro Abs: 10.5 10*3/uL — ABNORMAL HIGH (ref 1.7–7.7)
Neutrophils Relative %: 74 %
Platelets: 295 10*3/uL (ref 150–400)
RBC: 4.69 MIL/uL (ref 4.22–5.81)
RDW: 12.8 % (ref 11.5–15.5)
WBC: 14.2 10*3/uL — ABNORMAL HIGH (ref 4.0–10.5)
nRBC: 0 % (ref 0.0–0.2)

## 2020-02-26 LAB — SURGICAL PATHOLOGY

## 2020-02-26 NOTE — Progress Notes (Signed)
Nutrition Note  RD consulted for diet education for patient s/p bariatric surgery. Bariatric nurse coordinator providing education.  If nutrition issues arise, please consult RD.   Raynette Arras, MS, RD, LDN Inpatient Clinical Dietitian Contact information available via Amion  

## 2020-02-26 NOTE — Progress Notes (Signed)
Patient alert and oriented, Post op day 1.  Provided support and encouragement.  Encouraged pulmonary toilet, ambulation and small sips of liquids. Completed 12 ounces bari clear fluids and 3 ounces of protein.   All questions answered.  Will continue to monitor.

## 2020-02-26 NOTE — Progress Notes (Signed)
Pt stated that he had a case of "dry heaves" he declined to drink his scheduled tylenol for fear of vomiting. Zofran was administered and will continue to monitor effectiveness.

## 2020-02-26 NOTE — Progress Notes (Signed)
[place] [facility] Office:  [phone] General Surgery Progress Note   LOS: 1 day  POD -  1 Day Post-Op  Assessment and Plan: 1.  LAPAROSCOPIC SLEEVE GASTRECTOMY UPPER GI ENDOSCOPY - 02/25/2020 - Lucia Gaskins  Will keep through at least the afternoon.  He has made arrangements at a hotel locally through tomorrow.  2.  DVT prophylaxis - Lovenox   Active Problems:   Morbid obesity (HCC)  Subjective:  Doing well.  Some nausea, but has not vomited.  Objective:   Vitals:   02/26/20 0151 02/26/20 0605  BP: (!) 171/98 137/80  Pulse: (!) 57 (!) 55  Resp: 16 16  Temp: 99.4 F (37.4 C) 98.6 F (37 C)  SpO2: 98% 98%     Intake/Output from previous day:  08/31 0701 - 09/01 0700 In: 3720.6 [P.O.:480; I.V.:3140.6; IV Piggyback:100] Out: 4462 [Urine:1401; Blood:75]  Intake/Output this shift:  No intake/output data recorded.   Physical Exam:   General: Obese WM who is alert and oriented.    HEENT: Normal. Pupils equal. .   Lungs: Clear.  IS = 1,000 cc   Abdomen: Soft   Wound: BS present   Lab Results:    Recent Labs    02/25/20 1539 02/26/20 0507  WBC  --  14.2*  HGB 14.7 14.4  HCT 44.1 44.6  PLT  --  295    BMET  No results for input(s): NA, K, CL, CO2, GLUCOSE, BUN, CREATININE, CALCIUM in the last 72 hours.  PT/INR  No results for input(s): LABPROT, INR in the last 72 hours.  ABG  No results for input(s): PHART, HCO3 in the last 72 hours.  Invalid input(s): PCO2, PO2   Studies/Results:  No results found.   Anti-infectives:   Anti-infectives (From admission, onward)   Start     Dose/Rate Route Frequency Ordered Stop   02/25/20 0900  cefoTEtan (CEFOTAN) 2 g in sodium chloride 0.9 % 100 mL IVPB        2 g 200 mL/hr over 30 Minutes Intravenous On call to O.R. 02/25/20 0856 02/25/20 1034      Alphonsa Overall, MD, [facility] Surgery Office: [phone] 02/26/2020

## 2020-02-27 LAB — CBC WITH DIFFERENTIAL/PLATELET
Abs Immature Granulocytes: 0.03 10*3/uL (ref 0.00–0.07)
Basophils Absolute: 0 10*3/uL (ref 0.0–0.1)
Basophils Relative: 0 %
Eosinophils Absolute: 0.1 10*3/uL (ref 0.0–0.5)
Eosinophils Relative: 1 %
HCT: 41.5 % (ref 39.0–52.0)
Hemoglobin: 13.4 g/dL (ref 13.0–17.0)
Immature Granulocytes: 0 %
Lymphocytes Relative: 23 %
Lymphs Abs: 2.6 10*3/uL (ref 0.7–4.0)
MCH: 30.6 pg (ref 26.0–34.0)
MCHC: 32.3 g/dL (ref 30.0–36.0)
MCV: 94.7 fL (ref 80.0–100.0)
Monocytes Absolute: 1.2 10*3/uL — ABNORMAL HIGH (ref 0.1–1.0)
Monocytes Relative: 11 %
Neutro Abs: 7.5 10*3/uL (ref 1.7–7.7)
Neutrophils Relative %: 65 %
Platelets: 269 10*3/uL (ref 150–400)
RBC: 4.38 MIL/uL (ref 4.22–5.81)
RDW: 12.8 % (ref 11.5–15.5)
WBC: 11.5 10*3/uL — ABNORMAL HIGH (ref 4.0–10.5)
nRBC: 0 % (ref 0.0–0.2)

## 2020-02-27 MED ORDER — TRAMADOL HCL 50 MG PO TABS: 50.0000 mg | ORAL_TABLET | Freq: Four times a day (QID) | ORAL | 0 refills | Status: AC | PRN

## 2020-02-27 NOTE — Discharge Summary (Signed)
Physician Discharge Summary  Patient ID:  Benjamin Pearson  MRN: [id number]  DOB/AGE: 11/21/1980 39 y.o.  Admit date: 02/25/2020 Discharge date: 02/27/2020  Discharge Diagnoses:  1.  Morbid obesity  Weight - 299, BMI - 443.8 2. Elevated cholesterol 3. Has not taken the Covid vaccine  Active Problems:   Morbid obesity ([place])  Operation: Procedure(s): LAPAROSCOPIC SLEEVE GASTRECTOMY, UPPER GI ENDOSCOPY on 02/25/2020 Lucia Gaskins  Discharged Condition: good  Hospital Course: Benjamin Pearson is an 39 y.o. male whose primary care physician is Charlyne Mom, [facility] [phone]) and who was admitted 02/25/2020 with a chief complaint of morbid obesity.   He was brought to the operating room on 02/25/2020 and underwent LAPAROSCOPIC SLEEVE GASTRECTOMY, UPPER GI ENDOSCOPY.  He was nauseated the first post op day.  Today, he is doing better with liquids and ready to go home.   The discharge instructions were reviewed with the patient.  Consults: None  Significant Diagnostic Studies: Results for orders placed or performed during the hospital encounter of 02/25/20  Hemoglobin and hematocrit, blood  Result Value Ref Range   Hemoglobin 14.7 13.0 - 17.0 g/dL   HCT 44.1 39 - 52 %  CBC WITH DIFFERENTIAL  Result Value Ref Range   WBC 14.2 (H) 4.0 - 10.5 K/uL   RBC 4.69 4.22 - 5.81 MIL/uL   Hemoglobin 14.4 13.0 - 17.0 g/dL   HCT 44.6 39 - 52 %   MCV 95.1 80.0 - 100.0 fL   MCH 30.7 26.0 - 34.0 pg   MCHC 32.3 30.0 - 36.0 g/dL   RDW 12.8 11.5 - 15.5 %   Platelets 295 150 - 400 K/uL   nRBC 0.0 0.0 - 0.2 %   Neutrophils Relative % 74 %   Neutro Abs 10.5 (H) 1.7 - 7.7 K/uL   Lymphocytes Relative 15 %   Lymphs Abs 2.1 0.7 - 4.0 K/uL   Monocytes Relative 11 %   Monocytes Absolute 1.6 (H) 0 - 1 K/uL   Eosinophils Relative 0 %   Eosinophils Absolute 0.0 0 - 0 K/uL   Basophils Relative 0 %   Basophils Absolute 0.0 0 - 0 K/uL   Immature Granulocytes 0 %   Abs Immature Granulocytes 0.05 0.00 -  0.07 K/uL  CBC with Differential  Result Value Ref Range   WBC 11.5 (H) 4.0 - 10.5 K/uL   RBC 4.38 4.22 - 5.81 MIL/uL   Hemoglobin 13.4 13.0 - 17.0 g/dL   HCT 41.5 39 - 52 %   MCV 94.7 80.0 - 100.0 fL   MCH 30.6 26.0 - 34.0 pg   MCHC 32.3 30.0 - 36.0 g/dL   RDW 12.8 11.5 - 15.5 %   Platelets 269 150 - 400 K/uL   nRBC 0.0 0.0 - 0.2 %   Neutrophils Relative % 65 %   Neutro Abs 7.5 1.7 - 7.7 K/uL   Lymphocytes Relative 23 %   Lymphs Abs 2.6 0.7 - 4.0 K/uL   Monocytes Relative 11 %   Monocytes Absolute 1.2 (H) 0 - 1 K/uL   Eosinophils Relative 1 %   Eosinophils Absolute 0.1 0 - 0 K/uL   Basophils Relative 0 %   Basophils Absolute 0.0 0 - 0 K/uL   Immature Granulocytes 0 %   Abs Immature Granulocytes 0.03 0.00 - 0.07 K/uL  ABO/Rh  Result Value Ref Range   ABO/RH(D)      B POS Performed at [facility], [address] [place], [place]  [address]   Surgical pathology  Result Value Ref Range   SURGICAL PATHOLOGY      SURGICAL PATHOLOGY CASE: [id number] PATIENT: [facility] Surgical Pathology Report     Clinical History: Morbid obesity ([facility])     FINAL MICROSCOPIC DIAGNOSIS:  A. STOMACH, SLEEVE RESECTION: -Gross diagnosis only: Portion of unremarkable stomach.   GROSS DESCRIPTION:  Received fresh is a 25 x 6 x 3 cm portion of stomach, received centrally disrupted, with a smooth tan-pink glistening serosa, wall averages 0.2 cm, and a tan-pink to hyperemic mucosa with normal rugal folding.  There are minimal gelatinous red-brown contents.  Discrete lesions are not grossly seen.  Specimen is for gross only.  (AK 02/25/2020)    Final Diagnosis performed by Jaquita Folds, MD.   Electronically signed 02/26/2020 Technical and / or Professional components performed at [facility], [place] [address] [place], [place] [address].  Immunohistochemistry Technical component (if applicable) was performed at [facility]. [address] , [place], [place], [place] [address].   IMMUNOHISTOCHEMISTRY DISCLAIMER (if applicable): Some of these immunohistochemical stains may have been developed and the performance characteristics determine by [facility]. Some may not have been cleared or approved by the U.S. Food and Drug Administration. The FDA has determined that such clearance or approval is not necessary. This test is used for clinical purposes. It should not be regarded as investigational or for research. This laboratory is certified under the [place] (CLIA-88) as qualified to perform high complexity clinical laboratory testing.  The controls stained appropriately.     No results found.  Discharge Exam:  Vitals:   02/26/20 1341 02/27/20 0515  BP: (!) 149/97 140/79  Pulse: (!) 59 (!) 51  Resp: 17 16  Temp: 99.5 F (37.5 C) 98.8 F (37.1 C)  SpO2: 99% 100%    General: Obese WM who is alert and generally healthy appearing.  Lungs: Clear to auscultation and symmetric breath sounds. Heart:  RRR. No murmur or rub. Abdomen: Soft. No mass. Normal bowel sounds. Incisions look good.  Discharge Medications:   Allergies as of 02/27/2020   No Known Allergies     Medication List    STOP taking these medications   ibuprofen 200 MG tablet Commonly known as: ADVIL     TAKE these medications   BARIATRIC MULTIVITAMINS/IRON PO Take 1 tablet by mouth daily.   CALCIUM PO Take 1 tablet by mouth daily.   ondansetron 4 MG disintegrating tablet Commonly known as: ZOFRAN-ODT Take 4 mg by mouth every 6 (six) hours as needed.   pantoprazole 20 MG tablet Commonly known as: PROTONIX Take 20 mg by mouth daily.   REFRESH OP Place 1 drop into both eyes daily as needed (dry/irritated eyes.).   traMADol 50 MG tablet Commonly known as: ULTRAM Take 1 tablet (50 mg total) by mouth every 6 (six) hours as needed (pain).   VITAMIN C PO Take 1  tablet by mouth daily.   VITAMIN D3 PO Take 1 tablet by mouth daily.       Disposition: Discharge disposition: 01-Home or Self Care       Discharge Instructions    Ambulate hourly while awake   Complete by: As directed    Call MD for:  difficulty breathing, headache or visual disturbances   Complete by: As directed    Call MD for:  persistant dizziness or light-headedness   Complete by: As directed    Call MD  for:  persistant nausea and vomiting   Complete by: As directed    Call MD for:  redness, tenderness, or signs of infection (pain, swelling, redness, odor or green/yellow discharge around incision site)   Complete by: As directed    Call MD for:  severe uncontrolled pain   Complete by: As directed    Call MD for:  temperature >101 F   Complete by: As directed    Diet bariatric full liquid   Complete by: As directed    Incentive spirometry   Complete by: As directed    Perform hourly while awake       Follow-up Information    Alphonsa Overall, MD. Go on 03/26/2020.   Specialty: General Surgery Why: at 830 am Contact information: [address] [place] [address] [phone]        Surgery, Heath. Go on 04/21/2020.   Specialty: General Surgery Why: at 130 am Contact information: [place] [address] [phone]                Signed: Alphonsa Overall, M.D., [facility] Office:  [phone]  02/27/2020, 8:07 AM

## 2020-02-27 NOTE — Progress Notes (Signed)

## 2020-02-27 NOTE — Plan of Care (Signed)

## 2020-03-03 ENCOUNTER — Telehealth (HOSPITAL_COMMUNITY): Payer: Self-pay | Admitting: *Deleted

## 2020-03-10 ENCOUNTER — Encounter: Payer: BC Managed Care – PPO | Attending: Surgery | Admitting: Skilled Nursing Facility1

## 2020-03-10 ENCOUNTER — Other Ambulatory Visit: Payer: Self-pay

## 2020-03-10 DIAGNOSIS — E669 Obesity, unspecified: Secondary | ICD-10-CM | POA: Insufficient documentation

## 2020-03-11 ENCOUNTER — Other Ambulatory Visit: Payer: Self-pay

## 2020-03-12 NOTE — Progress Notes (Signed)
2 Week Post-Operative Nutrition Class   Patient was seen on 08/21/18 for Post-Operative Nutrition education at [facility]    Surgery date: 02/25/2020 Surgery type: sleeve Start weight at [facility]: 297 Weight today: 263.8   Body Composition Scale   Total Body Fat % 33  Visceral Fat 24  Fat-Free Mass % 66.9   Total Body Water % 47.9   Muscle-Mass lbs 49.7  Body Fat Displacement          Torso  lbs 54.1         Left Leg  lbs 10.8         Right Leg  lbs 10.8         Left Arm  lbs 5.4         Right Arm   lbs 5.4     The following the learning objectives were met by the patient during this course:  Identifies Phase 3 (Soft, High Proteins) Dietary Goals and will begin from 2 weeks post-operatively to 2 months post-operatively  Identifies appropriate sources of fluids and proteins   States protein recommendations and appropriate sources post-operatively  Identifies the need for appropriate texture modifications, mastication, and bite sizes when consuming solids  Identifies appropriate multivitamin and calcium sources post-operatively  Describes the need for physical activity post-operatively and will follow MD recommendations  States when to call healthcare provider regarding medication questions or post-operative complications   Handouts given during class include:  Phase 3A: Soft, High Protein Diet Handout   Follow-Up Plan: Patient will follow-up at [facility] in 6 weeks for 2 month post-op nutrition visit for diet advancement per MD.

## 2020-03-16 ENCOUNTER — Telehealth: Payer: Self-pay | Admitting: Dietician

## 2020-03-16 NOTE — Telephone Encounter (Signed)
I attempted to speak with patient via telephone to assess fluid intake and food tolerance since diet advancement to solid protein foods on 03/10/2020.  Patient unavailable, LVM.    Nat Christen [place]) Tayven Renteria, MS, RD, LDN

## 2020-04-27 ENCOUNTER — Ambulatory Visit: Payer: BC Managed Care – PPO | Admitting: Dietician

## 2020-05-06 ENCOUNTER — Encounter: Payer: BC Managed Care – PPO | Attending: Surgery | Admitting: Skilled Nursing Facility1

## 2020-05-06 ENCOUNTER — Other Ambulatory Visit: Payer: Self-pay

## 2020-05-06 DIAGNOSIS — E669 Obesity, unspecified: Secondary | ICD-10-CM | POA: Diagnosis not present

## 2020-05-06 NOTE — Progress Notes (Signed)
Bariatric Nutrition Follow-Up Visit Medical Nutrition Therapy   NUTRITION ASSESSMENT  Anthropometrics  Surgery date: 02/25/2020 Surgery type: sleeve Start weight at [facility]: 297 Weight today: 237.3   Body Composition Scale  05/06/2020  Total Body Fat % 33 29.4  Visceral Fat 24 19  Fat-Free Mass % 66.9 70.5   Total Body Water % 47.9 51.5   Muscle-Mass lbs 49.7 44.6  Body Fat Displacement           Torso  lbs 54.1 43.3         Left Leg  lbs 10.8 8.6         Right Leg  lbs 10.8 8.6         Left Arm  lbs 5.4 4.3         Right Arm   lbs 5.4 4.3   Clinical  Medical hx:  Medications:  Labs:    Lifestyle & Dietary Hx  Pt states he is living in [place] currently with his mother While selling his home.    Estimated daily fluid intake: 80 oz Estimated daily protein intake: 110-115 g Supplements: multi (without iron) and calcium and iron Current average weekly physical activity: gym 30-35 minutes 3 days a week (started last week)  24-Hr Dietary Recall: 110-115 grams protien First Meal: 3 [place] sausage or eggs or frozen egg bowl Snack: protein drink Second Meal: chicken or beef Snack: tuna pack Third Meal: chicken or beef Snack: tuna pack Beverages: water, sugar free Gatorade or powerade  Post-Op Goals/ Signs/ Symptoms Using straws: no Drinking while eating: no Chewing/swallowing difficulties: no Changes in vision: no Changes to mood/headaches: no Hair loss/changes to skin/nails: no Difficulty focusing/concentrating: no Sweating: no Dizziness/lightheadedness: no Palpitations: no  Carbonated/caffeinated beverages: no N/V/D/C/Gas: no Abdominal pain: no Dumping syndrome: on    NUTRITION DIAGNOSIS  Overweight/obesity ([place]-3.3) related to past poor dietary habits and physical inactivity as evidenced by completed bariatric surgery and following dietary guidelines for continued weight loss and healthy nutrition status.     NUTRITION INTERVENTION Nutrition counseling  (C-1) and education (E-2) to facilitate bariatric surgery goals, including: . Diet advancement to the next phase (phase 4) now including non-starchy  . The importance of consuming adequate calories as well as certain nutrients daily due to the body's need for essential vitamins, minerals, and fats . The importance of daily physical activity and to reach a goal of at least 150 minutes of moderate to vigorous physical activity weekly (or as directed by their physician) due to benefits such as increased musculature and improved lab values . The importance of intuitive eating specifically learning hunger-satiety cues and understanding the importance of learning a new body: The importance of mindful eating to avoid grazing behaviors   -Continue to aim for a minimum of 64 fluid ounces 7 days a week with at least 30 ounces being plain water  -Eat non-starchy vegetables 2 times a day 7 days a week  -Start out with soft cooked vegetables today and tomorrow; if tolerated begin to eat raw vegetables or cooked including salads  -Eat your 3 ounces of protein first then start in on your non-starchy vegetables; once you understand how much of your meal leads to satisfaction and not full while still eating 3 ounces of protein and non-starchy vegetables you can eat them in any order   -Continue to aim for 30 minutes of activity at least 5 times a week  -Do NOT cook with/add to your food: alfredo sauce, cheese sauce, barbeque sauce,  ketchup, fat back, butter, bacon grease, grease, Crisco, OR SUGAR   Handouts Provided Include   Non starchy vegetables   Learning Style & Readiness for Change Teaching method utilized: Visual & Auditory  Demonstrated degree of understanding via: Teach Back  Barriers to learning/adherence to lifestyle change: none identified  RD's Notes for Next Visit . Assess adherence to pt chosen goals   MONITORING & EVALUATION Dietary intake, weekly physical activity, body  weight  Next Steps Patient is to follow-up in 2 months

## 2020-07-07 ENCOUNTER — Encounter: Payer: BC Managed Care – PPO | Attending: Surgery | Admitting: Skilled Nursing Facility1

## 2020-07-07 DIAGNOSIS — E669 Obesity, unspecified: Secondary | ICD-10-CM | POA: Diagnosis present

## 2020-07-08 ENCOUNTER — Other Ambulatory Visit: Payer: Self-pay

## 2020-07-08 NOTE — Progress Notes (Signed)
Follow-up visit:  Post-Operative sleeve Surgery  Medical Nutrition Therapy:  Appt start time: 6:00pm end time:  7:00pm  Primary concerns today: Post-operative Bariatric Surgery Nutrition Management 6 Month Post-Op Class   Class done virtually through HIPPA compliant Webex: pt verbalizes the acceptance of continuing with this format understanding the limitations of this visit type: pt was identified through DOB and name  Anthropometrics  Surgery date: 02/25/2020 Surgery type: sleeve Start weight at [facility]: 297 Weight today: virtual appt   Body Composition Scale  05/06/2020  Total Body Fat % 33 29.4  Visceral Fat 24 19  Fat-Free Mass % 66.9 70.5   Total Body Water % 47.9 51.5   Muscle-Mass lbs 49.7 44.6  Body Fat Displacement           Torso  lbs 54.1 43.3         Left Leg  lbs 10.8 8.6         Right Leg  lbs 10.8 8.6         Left Arm  lbs 5.4 4.3         Right Arm   lbs 5.4 4.3      Information Reviewed/ Discussed During Appointment: -Review of composition scale numbers -Fluid requirements (64-100 ounces) -Protein requirements (60-80g) -Strategies for tolerating diet -Advancement of diet to include Starchy vegetables -Barriers to inclusion of new foods -Inclusion of appropriate multivitamin and calcium supplements  -Exercise recommendations   Fluid intake: adequate   Medications: See List Supplementation: appropriate    Using straws: no Drinking while eating: no Having you been chewing well: yes Chewing/swallowing difficulties: no Changes in vision: no Changes to mood/headaches: no Hair loss/[place] to skin/Changes to nails: no Any difficulty focusing or concentrating: no Sweating: no Dizziness/Lightheaded: no Palpitations: no  Carbonated beverages: no N/V/D/C/GAS: no Abdominal Pain: no Dumping syndrome: no  Recent physical activity:  ADL's  Progress Towards Goal(s):  In Progress Teaching method utilized: Visual & Auditory  Demonstrated degree of  understanding via: Teach Back  Readiness Level: Action Barriers to learning/adherence to lifestyle change: none identified  Handouts given during visit include:  Phase V diet Progression   Goals Sheet  The Benefits of Exercise are endless.....  Support Group Topics  Visual Auditory Hands on  Demonstrated degree of understanding via:  Teach Back   Monitoring/Evaluation:  Dietary intake, exercise, and body weight. Follow up in 3 months for 9 month post-op visit.

## 2020-09-21 IMAGING — RF DG UGI W SINGLE CM
13 of 16 series · 15 of 24 positions shown · non-contrast
Comparison: None.

CLINICAL DATA: Preop for bariatric surgery.

EXAM:
UPPER GI SERIES WITH KUB
TECHNIQUE: After obtaining a scout radiograph a routine upper GI series was
performed using thin barium and barium tablet
FLUOROSCOPY TIME:  Fluoroscopy Time:  4 minutes 24 seconds
Radiation Exposure Index (if provided by the fluoroscopic device):
64.9 mGy
Number of Acquired Spot Images: 0

[Series 6: cp_standard · 0.34mm/px · 1 of 26 frames shown (1 of 13)]
[frame 4/26]
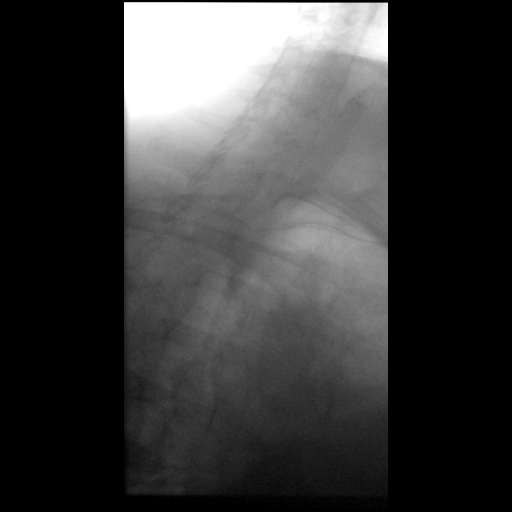

[Series 7: cp_standard · 0.34mm/px · 1 of 37 frames shown (2 of 13)]
[frame 6/37]
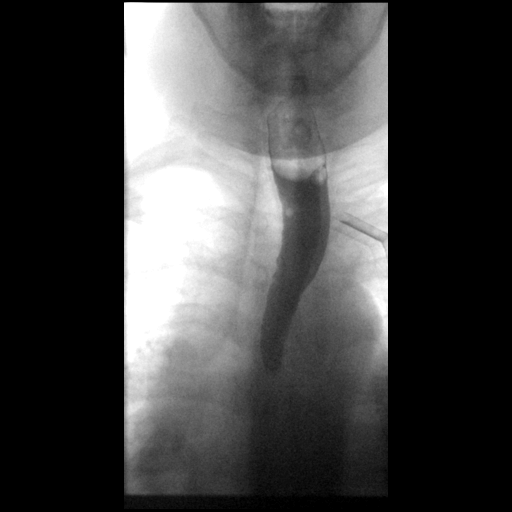

[Series 8: cp_standard · 0.34mm/px · 1 of 8 frames shown (3 of 13)]
[frame 5/8]
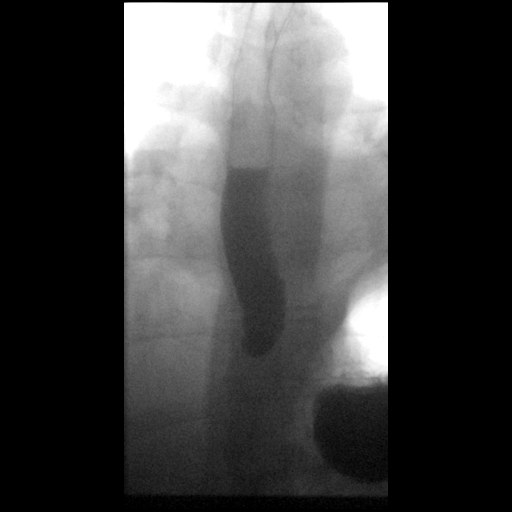

[Series 9: cp_standard · 0.34mm/px · 1 of 4 frames shown (4 of 13)]
[frame 1/4]
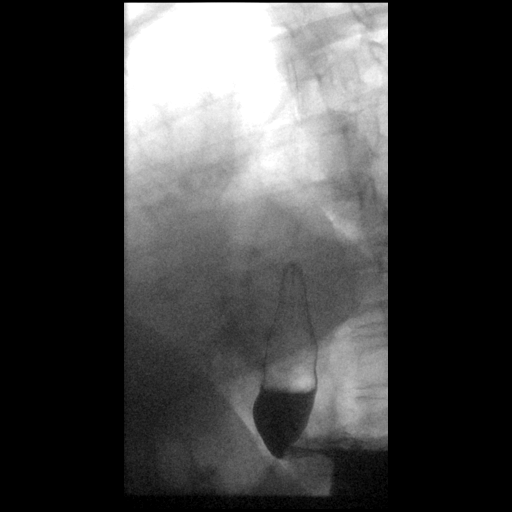

[Series 10: cp_standard · 0.34mm/px · 1 of 26 frames shown (5 of 13)]
[frame 14/26]
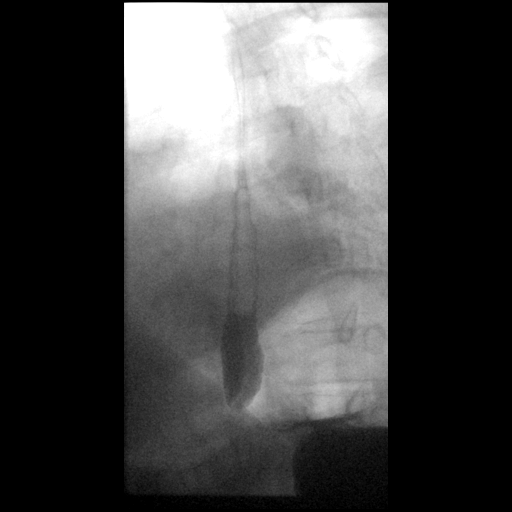

[Series 11: cp_standard · 0.34mm/px · 1 of 15 frames shown (6 of 13)]
[frame 3/15]
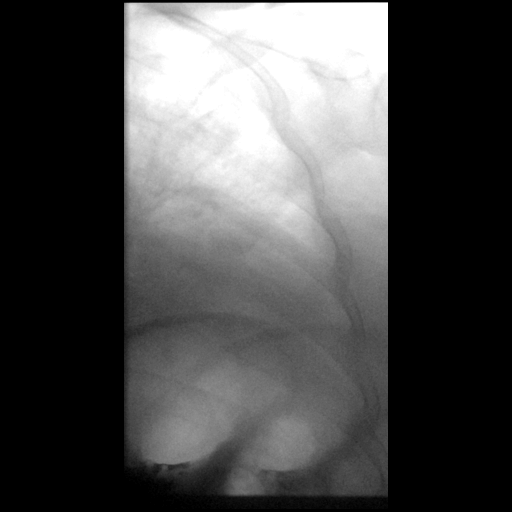

[Series 12: cp_standard · 0.34mm/px · 1 of 3 frames shown (7 of 13)]
[frame 3/3]
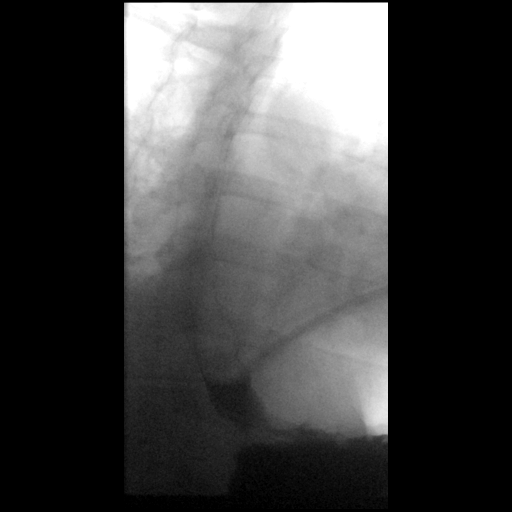

[Series 16: cp_standard · 0.35mm/px · 1 of 6 frames shown (8 of 13)]
[frame 4/6]
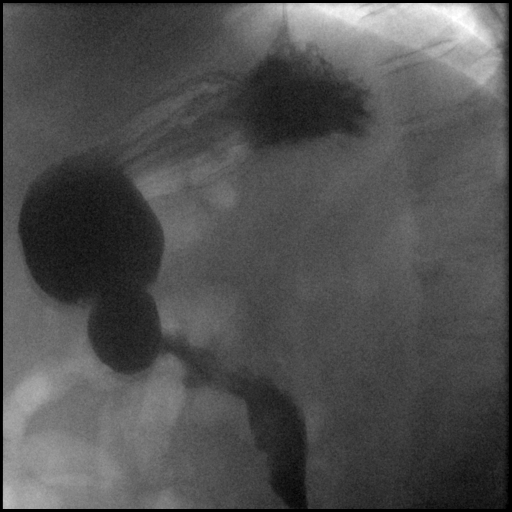

[Series 18: cp_standard · 0.18mm/px · 1 of 1 slices shown (9 of 13)]
[im 1/1]
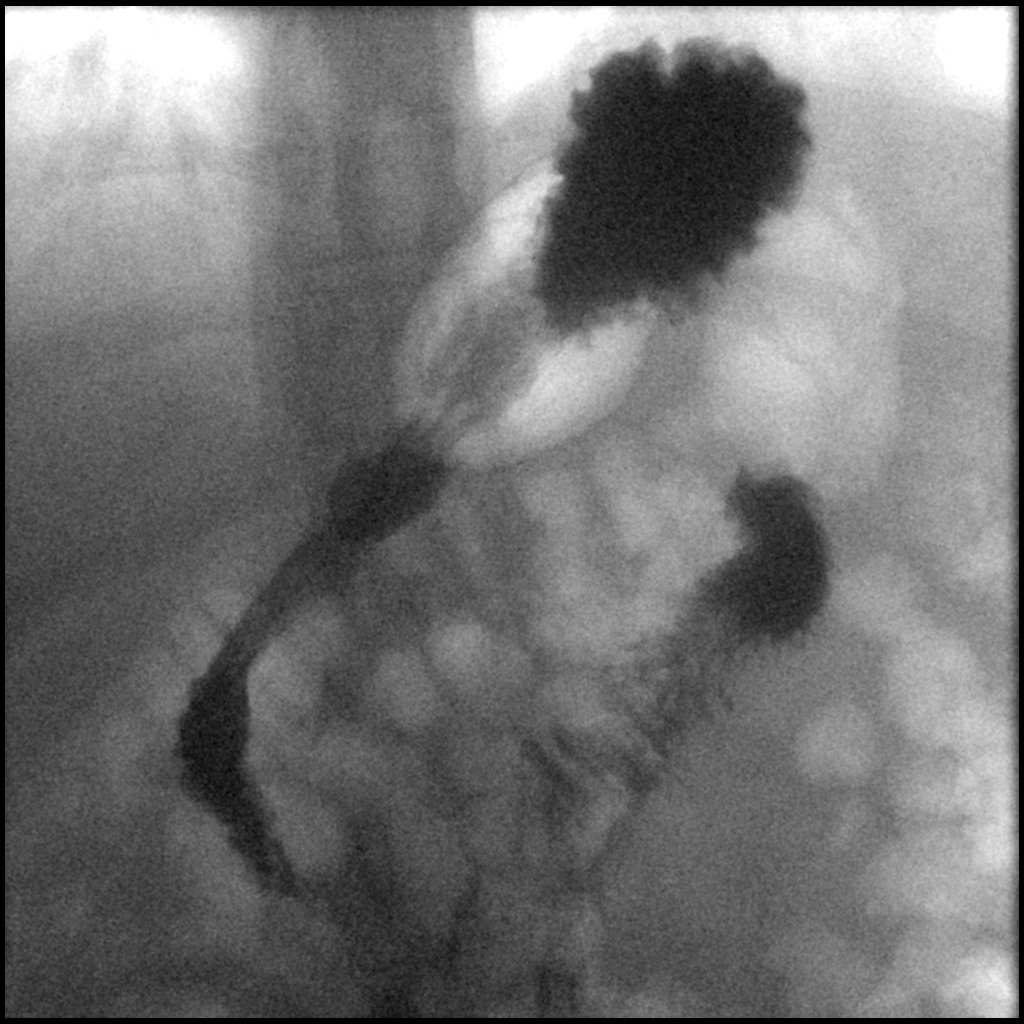

[Series 23: cp_standard · 0.53mm/px · 2 of 30 frames shown (10 of 13)]
[frame 5/30]
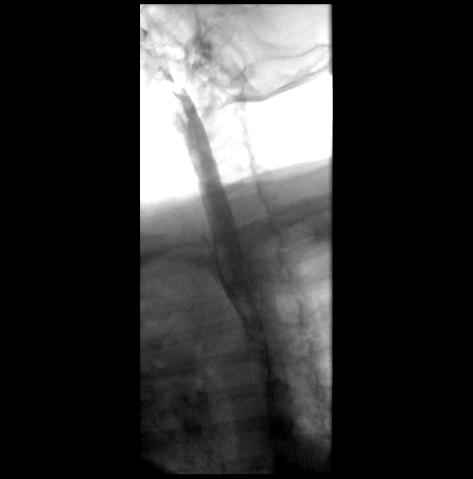
[frame 26/30]
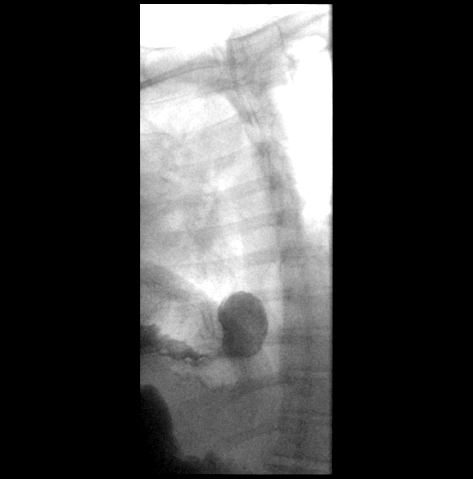

[Series 24: cp_standard · 0.53mm/px · 1 of 43 frames shown (11 of 13)]
[frame 42/43]
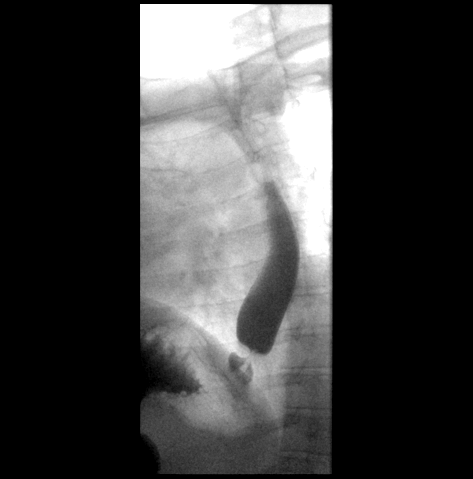

[Series 26: cp_standard · 0.53mm/px · 2 of 8 frames shown (12 of 13)]
[frame 2/8]
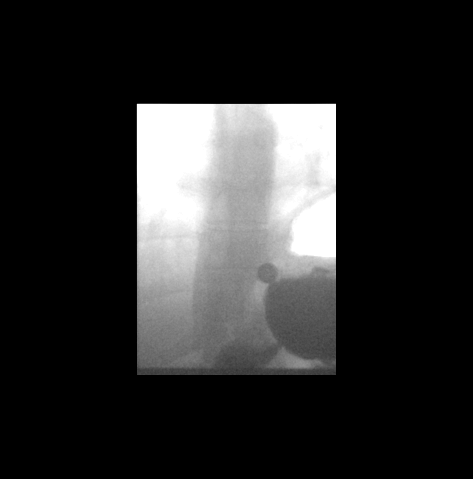
[frame 6/8]
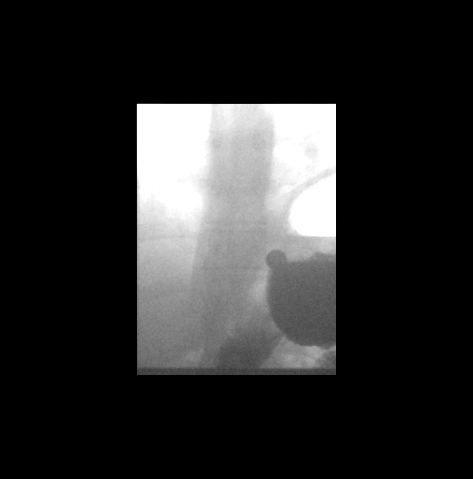

[Series 27: cp_standard · 0.53mm/px · 1 of 25 frames shown (13 of 13)]
[frame 23/25]
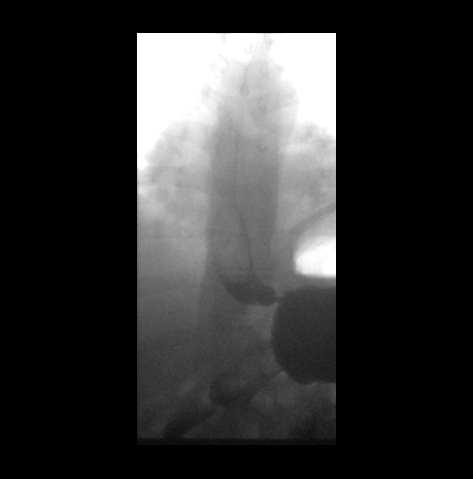

[15 of 24 positions shown; findings below may reference images not displayed]

FINDINGS: Scout image was obtained in the upper abdomen showing unremarkable
bowel gas pattern and stool in the proximal colon

Limited swallow assessment without gross sign of aspiration.

Contrast flows from the esophagus into the stomach with mild
narrowing of the distal esophagus. Motility with intact primary wave
but with some mild tertiary peristalsis and residual when standing.

Contrast flows easily from the stomach into the duodenum and jejunum
with normal duodenal jejunal anatomy.

Upon swallowing a barium tablet it became transiently arrested in
the distal esophagus at the GE junction. Administration of thin
barium shows some narrowing at this point but after administration
of thin barium this tablet passed without difficulty into the
stomach.
IMPRESSION: 1. Mild esophageal dysmotility.
2. Mild narrowing of the distal esophagus at the GE junction.
Narrowing not less than 13 mm based on assessment with barium
tablet. Mild stricture in this area is considered. Area not well
assessed on single contrast evaluation. Assessment with endoscopy
may be helpful prior to gastric banding as clinically warranted.

## 2021-09-17 ENCOUNTER — Encounter (HOSPITAL_COMMUNITY): Payer: Self-pay | Admitting: *Deleted

## 2023-09-20 ENCOUNTER — Encounter (HOSPITAL_COMMUNITY): Payer: Self-pay | Admitting: *Deleted
